# Patient Record
Sex: Female | Born: 1985 | Race: White | Hispanic: No | Smoking: Current every day smoker
Health system: Southern US, Community
[De-identification: ages and names within clinical notes are randomized; demographics above are authoritative.]

## PROBLEM LIST (undated history)

## (undated) ENCOUNTER — Inpatient Hospital Stay: Payer: Self-pay

## (undated) DIAGNOSIS — F419 Anxiety disorder, unspecified: Secondary | ICD-10-CM

## (undated) DIAGNOSIS — F32A Depression, unspecified: Secondary | ICD-10-CM

## (undated) DIAGNOSIS — K219 Gastro-esophageal reflux disease without esophagitis: Secondary | ICD-10-CM

## (undated) DIAGNOSIS — F1910 Other psychoactive substance abuse, uncomplicated: Secondary | ICD-10-CM

## (undated) HISTORY — DX: Anxiety disorder, unspecified: F41.9

## (undated) HISTORY — PX: OTHER SURGICAL HISTORY: SHX169

## (undated) HISTORY — PX: LEEP: SHX91

---
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
## 2005-10-05 ENCOUNTER — Emergency Department: Payer: Self-pay | Admitting: Internal Medicine

## 2006-12-18 ENCOUNTER — Emergency Department: Payer: Self-pay | Admitting: Emergency Medicine

## 2006-12-19 ENCOUNTER — Other Ambulatory Visit: Payer: Self-pay

## 2007-02-17 ENCOUNTER — Emergency Department: Payer: Self-pay | Admitting: Emergency Medicine

## 2007-12-24 ENCOUNTER — Emergency Department: Payer: Self-pay | Admitting: Emergency Medicine

## 2007-12-24 IMAGING — CR RIGHT TIBIA AND FIBULA - 2 VIEW
1 series · 2 of 2 positions shown · non-contrast
Comparison: none

REASON FOR EXAM: Foreign body
COMMENTS:

RESULT:     Images of the RIGHT tibia and fibula demonstrate a curvilinear
metallic density on the AP view superimposed over the proximal third of the
fibula. This is anterior in position on the lateral view. No acute bony
abnormality is evident.

[Series 1: view not recorded · 0.17mm/px · 2 of 2 slices shown]
[im 1/2]
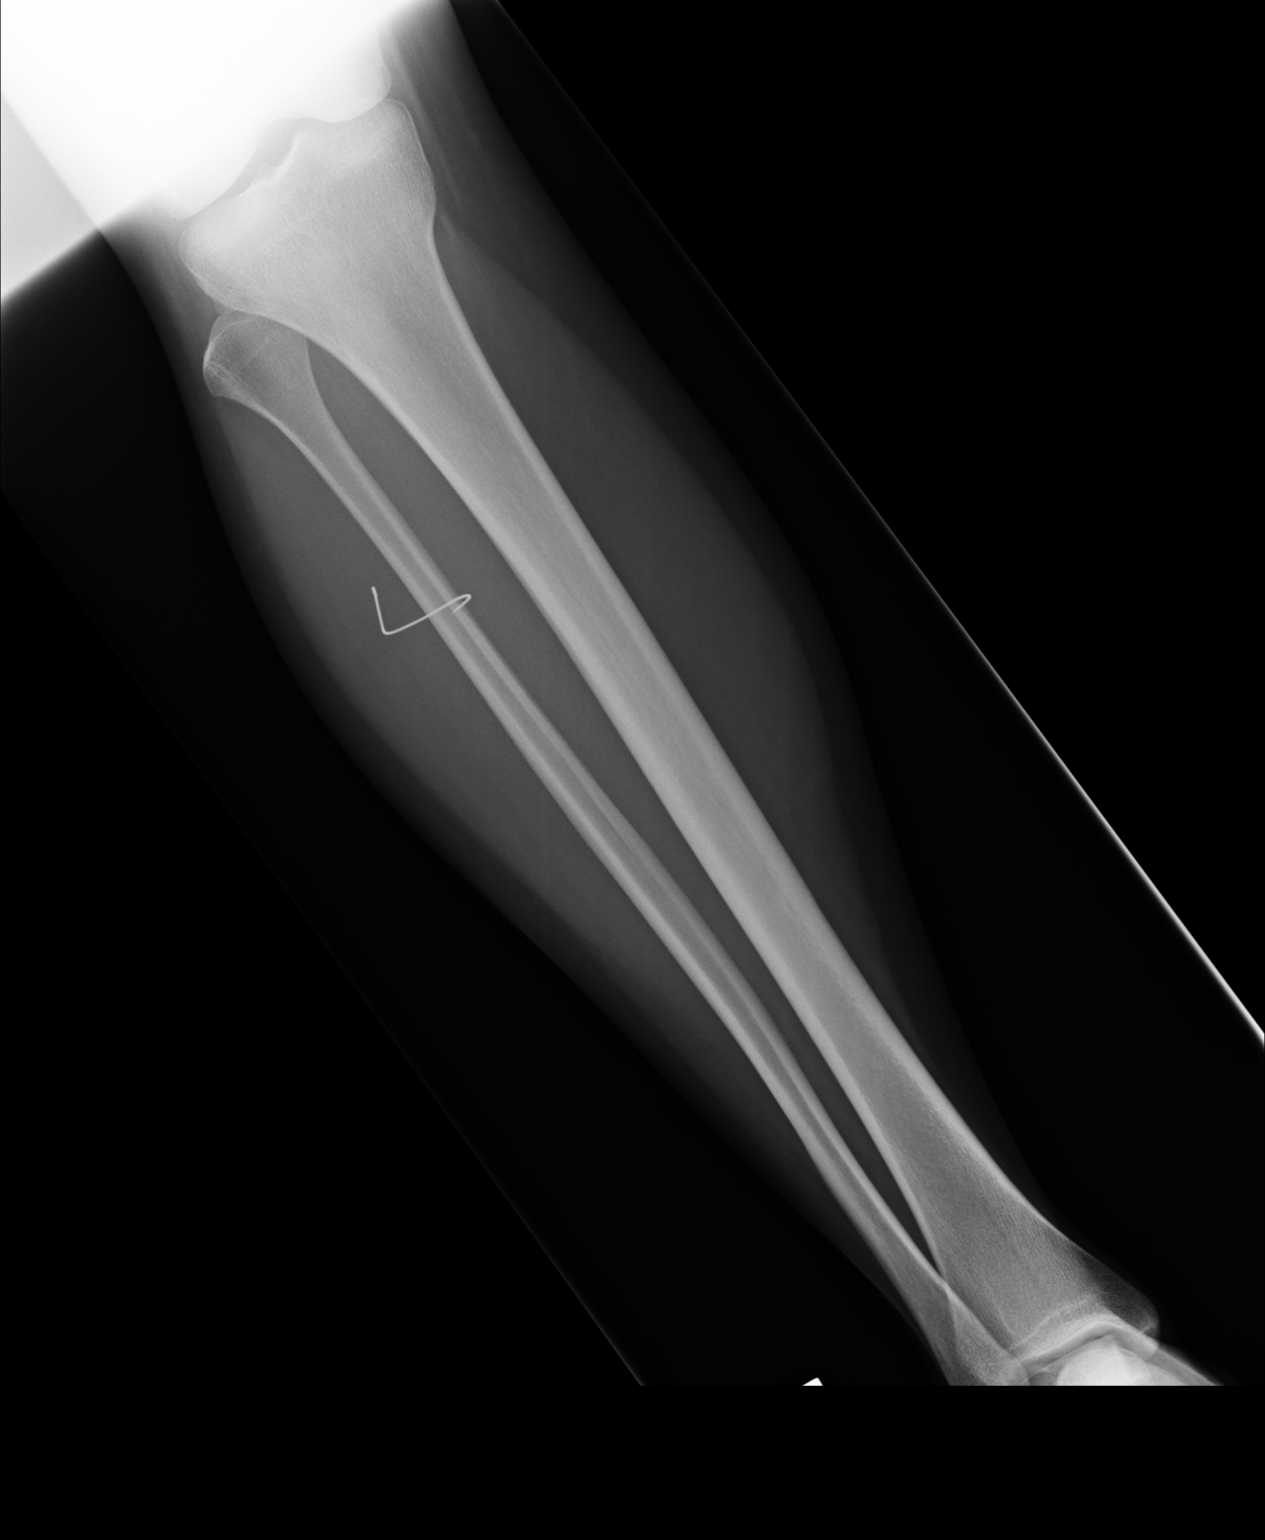
[im 2/2]
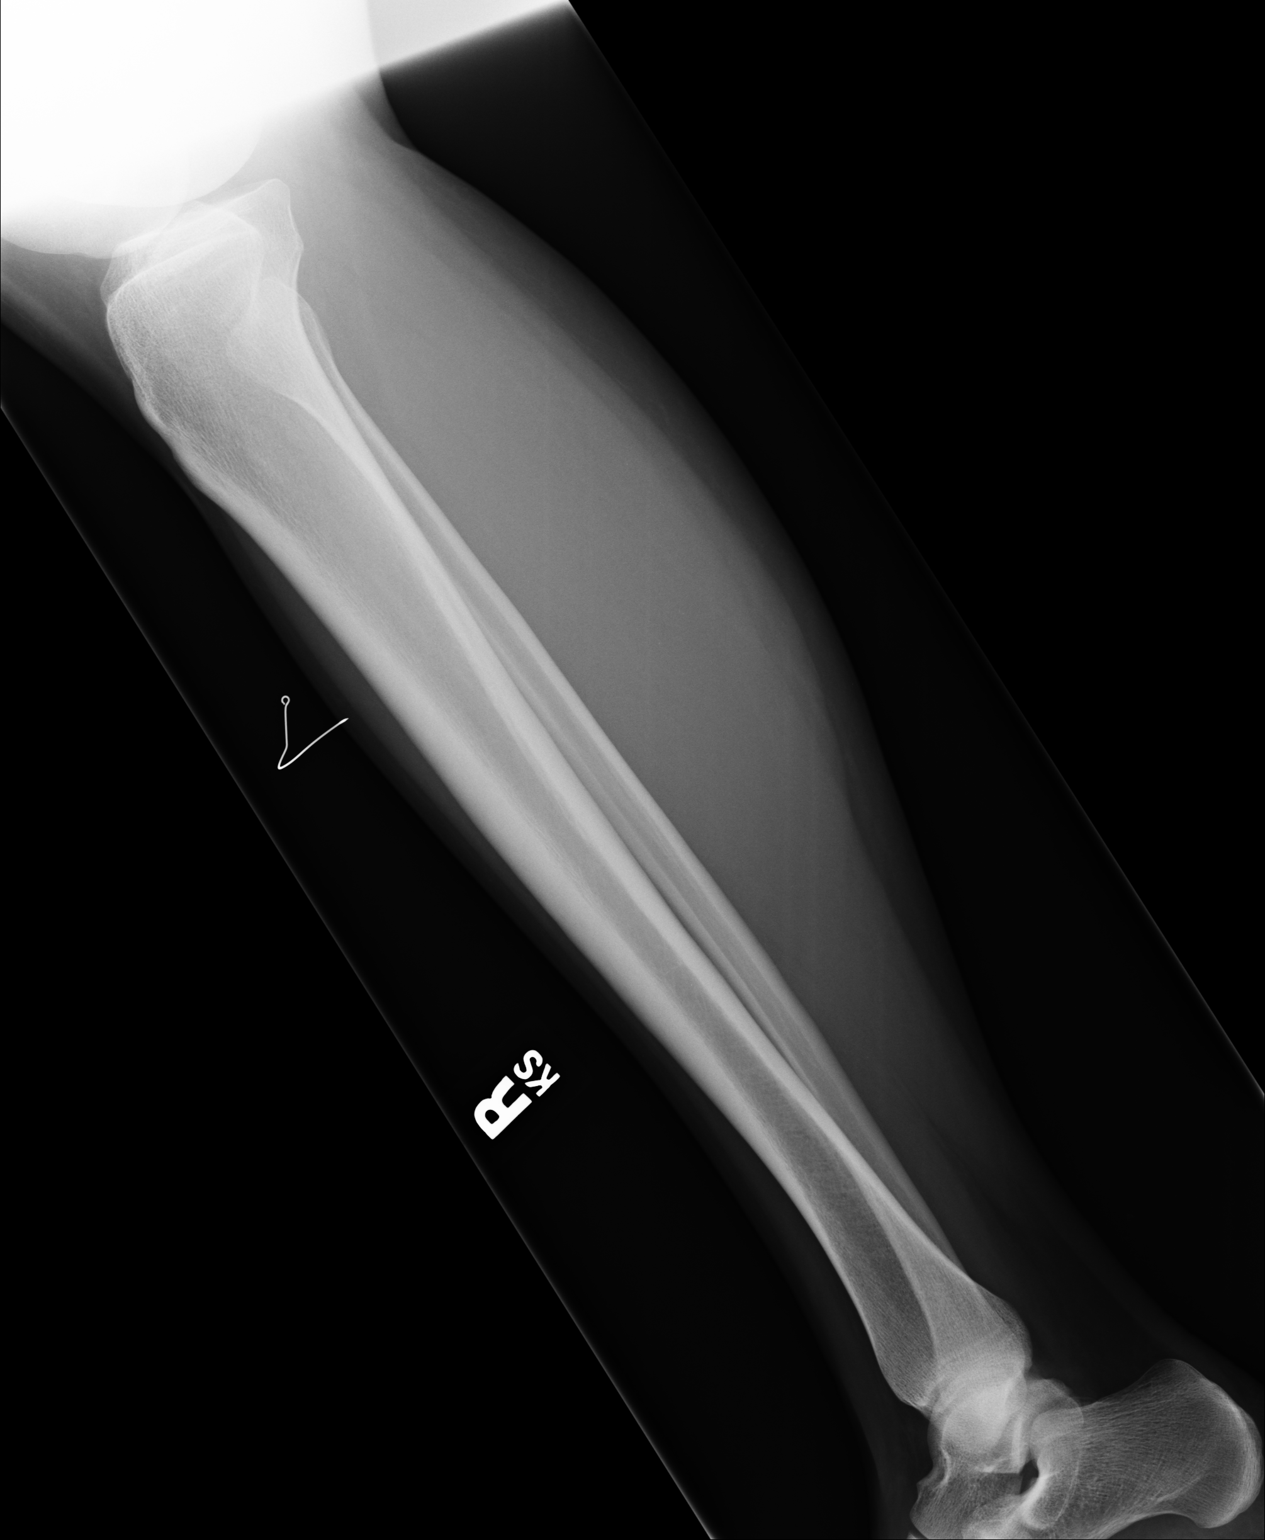

[2 of 2 positions shown; findings below may reference images not displayed]

IMPRESSION: Radiopaque foreign body over the proximal anterolateral
third of the RIGHT lower leg.

## 2009-12-03 ENCOUNTER — Emergency Department: Payer: Self-pay | Admitting: Emergency Medicine
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
## 2010-12-25 ENCOUNTER — Emergency Department: Payer: Self-pay | Admitting: Emergency Medicine

## 2012-07-13 ENCOUNTER — Emergency Department: Payer: Self-pay | Admitting: Emergency Medicine

## 2012-07-13 LAB — CBC
HCT: 41.7 % (ref 35.0–47.0)
HGB: 13.9 g/dL (ref 12.0–16.0)
MCHC: 33.4 g/dL (ref 32.0–36.0)
MCV: 90 fL (ref 80–100)
Platelet: 271 10*3/uL (ref 150–440)
RBC: 4.66 10*6/uL (ref 3.80–5.20)
WBC: 13.8 10*3/uL — ABNORMAL HIGH (ref 3.6–11.0)

## 2012-07-13 LAB — URINALYSIS, COMPLETE
Blood: NEGATIVE
Nitrite: NEGATIVE
Ph: 6 (ref 4.5–8.0)
WBC UR: 2 /HPF (ref 0–5)

## 2012-07-13 LAB — WET PREP, GENITAL

## 2012-07-13 LAB — COMPREHENSIVE METABOLIC PANEL
Albumin: 3.6 g/dL (ref 3.4–5.0)
Alkaline Phosphatase: 68 U/L (ref 50–136)
Anion Gap: 6 — ABNORMAL LOW (ref 7–16)
BUN: 8 mg/dL (ref 7–18)
Chloride: 108 mmol/L — ABNORMAL HIGH (ref 98–107)
EGFR (African American): >60
Glucose: 88 mg/dL (ref 65–99)
Potassium: 3.7 mmol/L (ref 3.5–5.1)
SGOT(AST): 20 U/L (ref 15–37)
SGPT (ALT): 20 U/L (ref 12–78)
Sodium: 138 mmol/L (ref 136–145)
Total Protein: 7 g/dL (ref 6.4–8.2)

## 2012-07-13 IMAGING — US US OB < 14 WEEKS - US OB TV
1 series · 13 of 28 positions shown · non-contrast
Comparison: none

REASON FOR EXAM: left ovarian tenderness, abdominal pain,
8 weeks preg
COMMENTS:

PROCEDURE:     US  - US OB LESS THAN 14 WEEKS/W TRANS  - [DATE]  [DATE]
RESULT:     Comparison: [DATE]
TECHNIQUE: Multiple grayscale and color Doppler images were obtained of the
pelvis via transabdominal and endovaginal ultrasound.

[Series 1: us ob < 14 weeks - us ob tv · 0.25mm/px · 94 acquisitions, 13 frames shown]
[im 4/94]
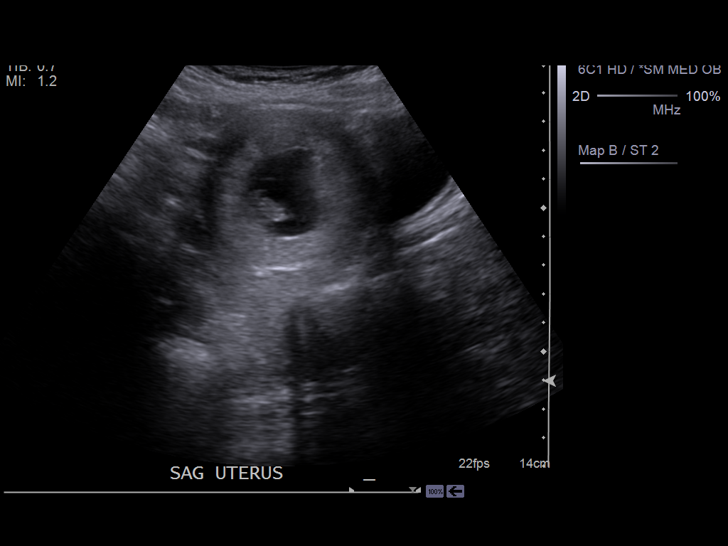
[im 11/94]
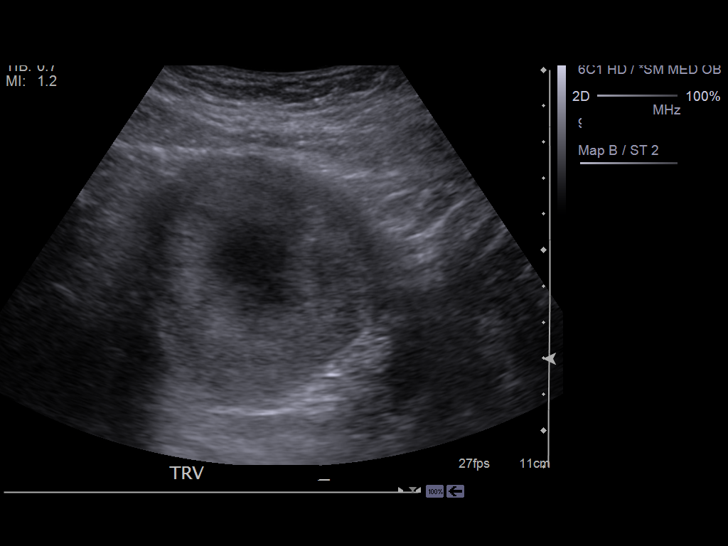
[im 18/94]
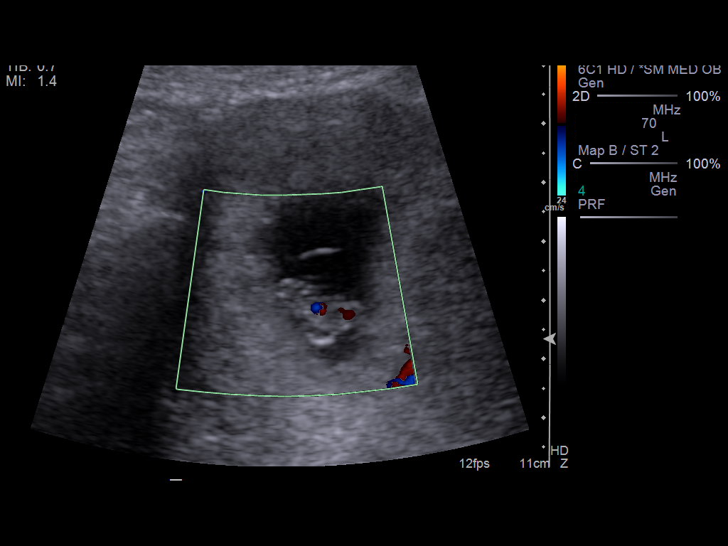
[im 25/94]
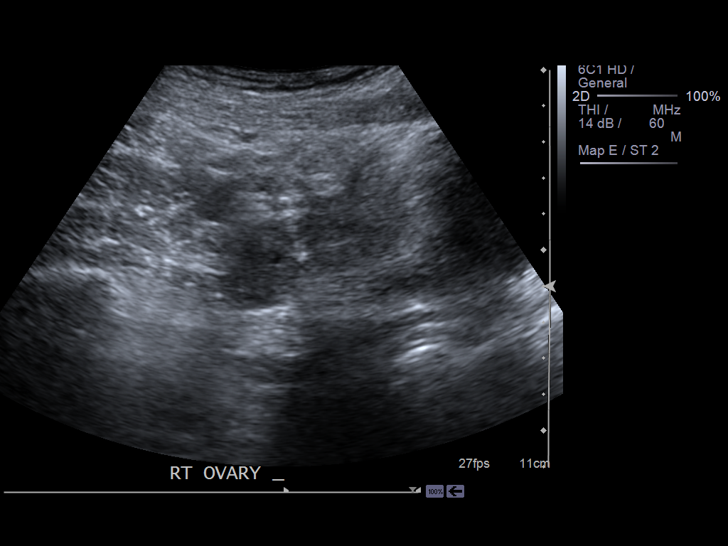
[im 32/94]
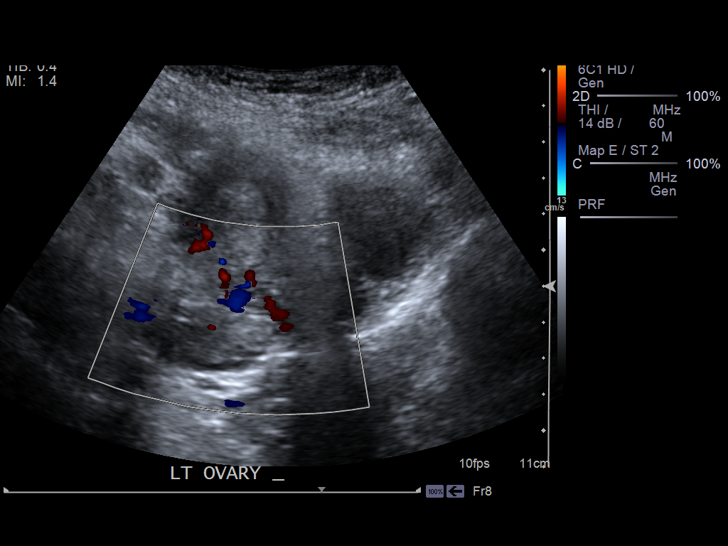
[im 38/94]
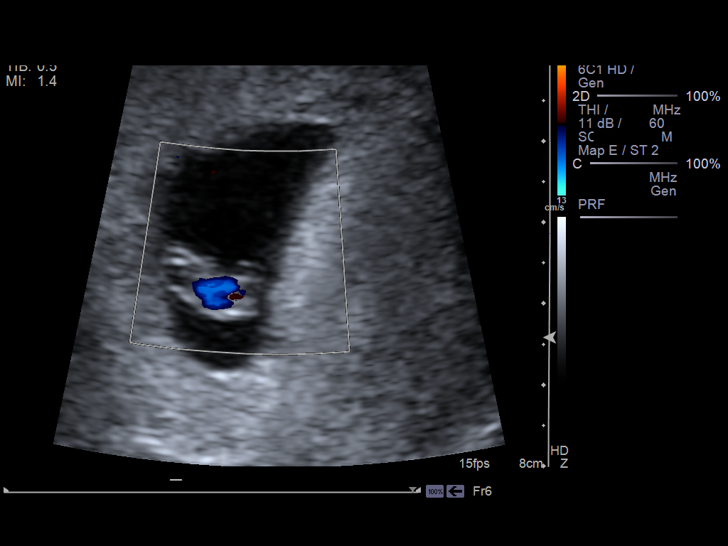
[im 49/94]
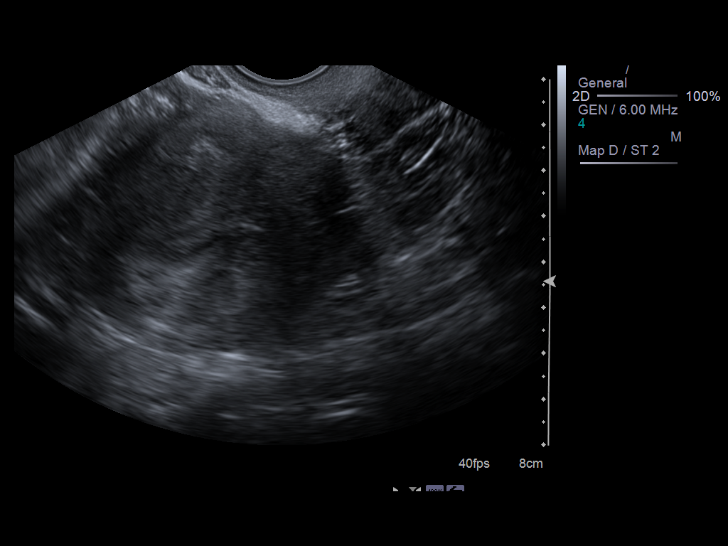
[im 56/94]
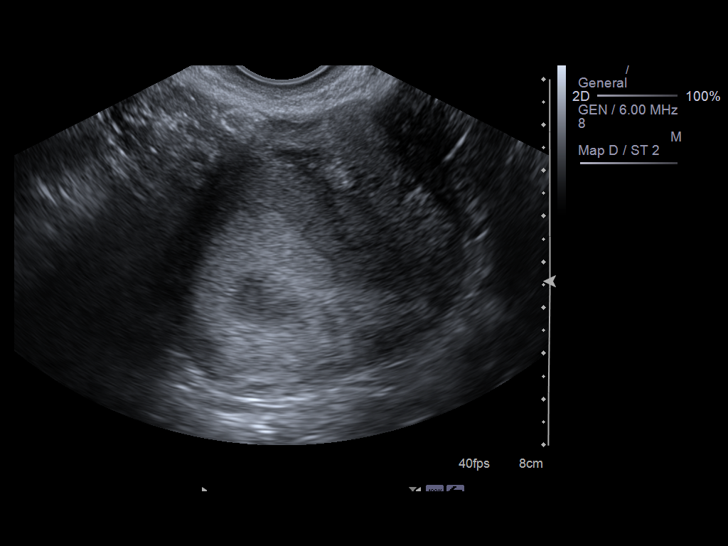
[im 63/94]
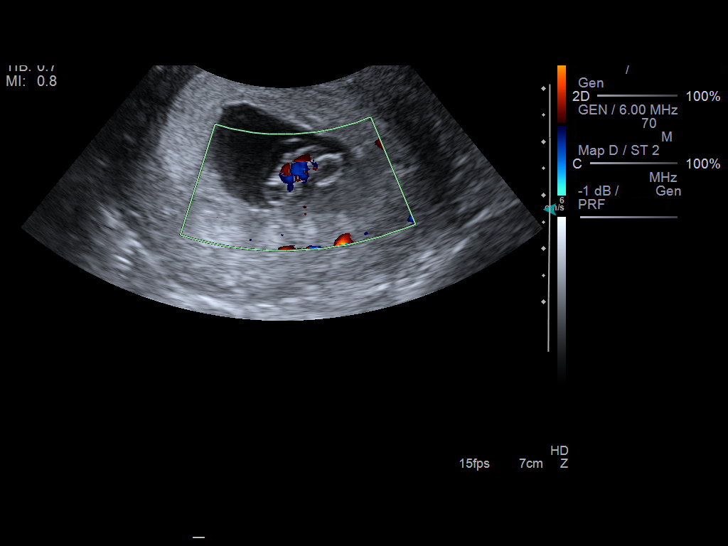
[im 69/94]
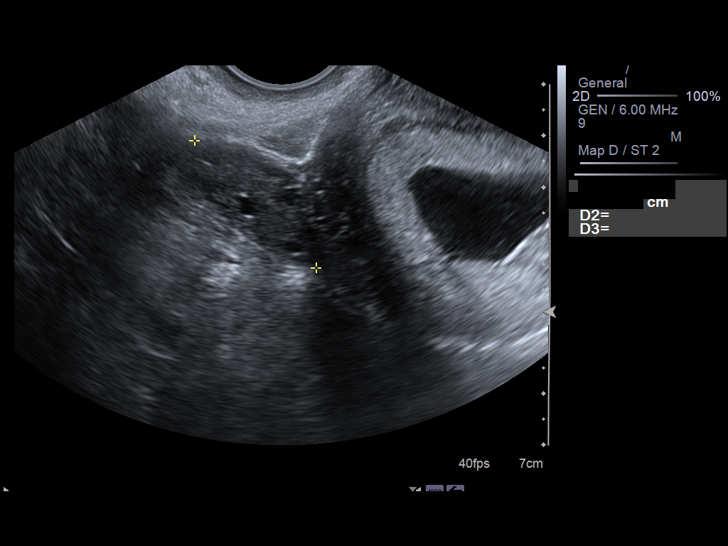
[im 76/94]
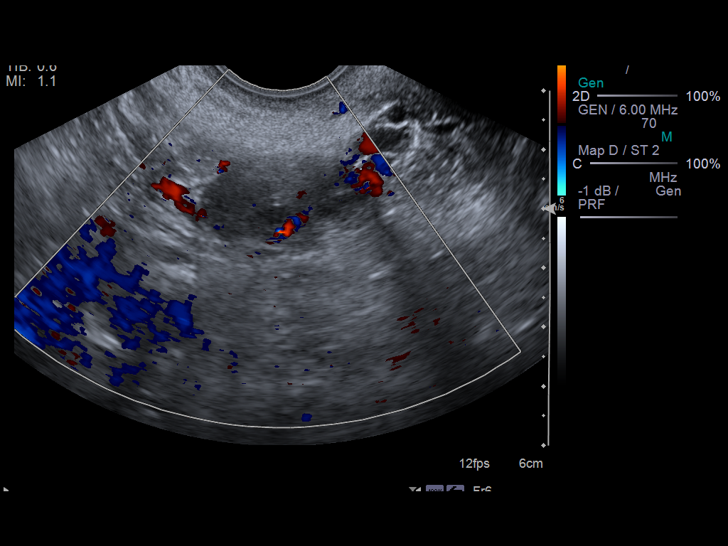
[im 83/94]
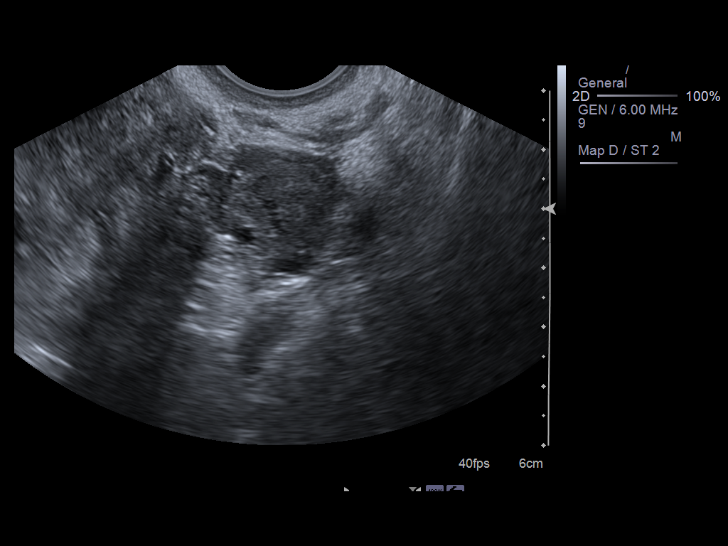
[im 90/94]
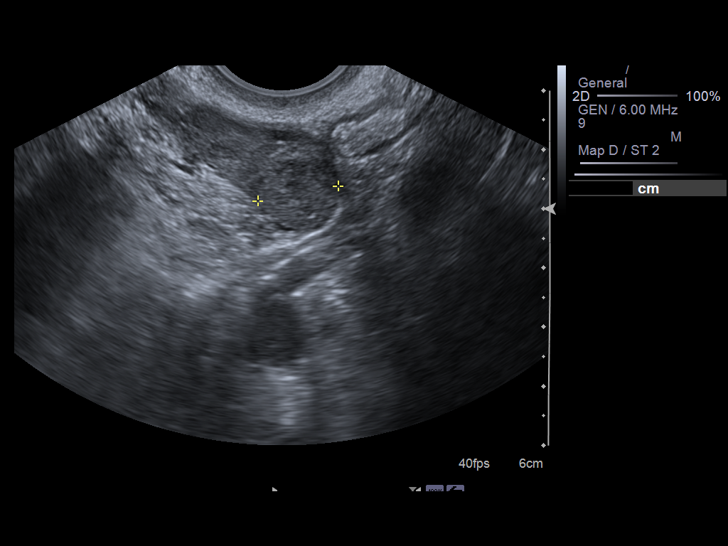

[13 of 28 positions shown; findings below may reference images not displayed]

FINDINGS: There is a gestational sac within the endometrial canal. There is a fetal
pole the crown-rump length of 1.63 cm, which correlates with estimated
gestational age of 8 weeks 0 days. A yolk sac is present. The fetal heart
rate was 155 beats per minute.

The right ovary measures 3.4 x 2.0 x 1.8 cm. The left ovary measures 3.4 x
1.9 x 1.9 cm. Color Doppler flow is associated with the bilateral ovaries.
Spectral Doppler imaging was not performed. There is a small heterogeneous
solid mass in the left ovary which could represent a corpus luteum cyst. It
measures 1.7 x 1.4 x 1.4 cm.
IMPRESSION: 1. Single live intrauterine pregnancy with estimated gestational age of 8
weeks 0 days.
2. There is a small, 1.7 cm solid mass in the left ovary which is
nonspecific, but likely represents a corpus luteum cyst. Followup ultrasound
is suggested.

[REDACTED]

## 2012-09-04 ENCOUNTER — Emergency Department: Payer: Self-pay | Admitting: Emergency Medicine

## 2012-09-04 LAB — CBC
HCT: 36.5 % (ref 35.0–47.0)
RBC: 4.23 10*6/uL (ref 3.80–5.20)
RDW: 13.5 % (ref 11.5–14.5)
WBC: 11.3 10*3/uL — ABNORMAL HIGH (ref 3.6–11.0)

## 2012-09-04 LAB — URINALYSIS, COMPLETE
Glucose,UR: NEGATIVE mg/dL (ref 0–75)
Ketone: NEGATIVE
Leukocyte Esterase: NEGATIVE
Nitrite: NEGATIVE
Ph: 6 (ref 4.5–8.0)
Protein: NEGATIVE
Squamous Epithelial: 4
WBC UR: 1 /HPF (ref 0–5)

## 2012-09-04 LAB — WET PREP, GENITAL

## 2012-09-04 LAB — HCG, QUANTITATIVE, PREGNANCY: Beta Hcg, Quant.: 28995 m[IU]/mL — ABNORMAL HIGH

## 2013-02-24 ENCOUNTER — Inpatient Hospital Stay: Payer: Self-pay

## 2013-02-24 LAB — CBC WITH DIFFERENTIAL/PLATELET
Basophil %: 1 %
Eosinophil #: 0.1 10*3/uL (ref 0.0–0.7)
HCT: 39.4 % (ref 35.0–47.0)
Lymphocyte %: 10.9 %
MCH: 29.9 pg (ref 26.0–34.0)
MCV: 87 fL (ref 80–100)
Monocyte #: 1.2 x10 3/mm — ABNORMAL HIGH (ref 0.2–0.9)
RBC: 4.54 10*6/uL (ref 3.80–5.20)
WBC: 22 10*3/uL — ABNORMAL HIGH (ref 3.6–11.0)

## 2013-02-25 LAB — HEMATOCRIT: HCT: 36.4 % (ref 35.0–47.0)
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
## 2014-06-10 LAB — HM PAP SMEAR: HM Pap smear: NEGATIVE

## 2014-08-06 NOTE — H&P (Signed)
L&D Evaluation:  History:  HPI 29 year old G2 P1001 with EDC=02/24/2013 by LMP=05/20/2012 presents at 40 weeks with onset of contractions at 0600 this AM. No VB or LOF. BAby active. Prior C-section in 2003 for FITL, desires TOLAC. Has been counselled on risks of uterine rupture with spontaneous labor of <1%. [facility] h/o [facility]EEP [facility] 2003, MJ and tobacco use , and an E. coli UTI in Oct txed with Macrobid. Desires BTL pp and has been counselled on permanence, failure rate and risks. LABS: A POS, RI, VI, GBS positive. Had TDAP 01/17/2013.   Presents with contractions   Patient's Surgical History LEEP  Previous C-Section   Medications Pre Natal Vitamins   Allergies NKDA   Social History tobacco  drugs  MJ/ smokes < 1/2 PPD of cigs   Family History Non-Contributory   ROS:  ROS see HPI   Exam:  Urine Protein not completed   General breating thru some ctxs   Mental Status clear   Chest clear   Heart normal sinus rhythm, no murmur/gallop/rubs   Abdomen gravid, tender with contractions   Estimated Fetal Weight Average for gestational age   Fetal Position cephalic, ROT   Edema no edema   Reflexes 1+   Pelvic no external lesions, 2-3/80%/-2/post per RN exam   Mebranes Intact   FHT normal rate with no decels, 135 with accels to 140s to 150   Ucx regular, q3-6 min apart   Skin dry   Impression:  Impression IUP at 40 weeks with prior C-section. Probable early labor. Desires TOLAC.   Plan:  Plan EFM/NST, monitor contractions and for cervical change, Recheck in 1-2 hours. If progressing, will initiate VBAC protocol and start GBS prophyllaxis.   Electronic Signatures: Trinna BalloonGutierrez, Jedediah Noda L (CNM)  (Signed [phone]-Nov-14 11:34)  Authored: L&D Evaluation   Last Updated: 29-Nov-14 11:34 by Trinna BalloonGutierrez, Kushi Kun L (CNM)

## 2014-12-31 ENCOUNTER — Emergency Department
Admission: EM | Admit: 2014-12-31 | Discharge: 2015-01-01 | Disposition: A | Discharge status: Home / Self Care | Payer: Self-pay | Attending: Emergency Medicine | Admitting: Emergency Medicine

## 2014-12-31 ENCOUNTER — Encounter: Payer: Self-pay | Admitting: *Deleted

## 2014-12-31 ENCOUNTER — Emergency Department: Payer: Self-pay

## 2014-12-31 DIAGNOSIS — J029 Acute pharyngitis, unspecified: Secondary | ICD-10-CM

## 2014-12-31 DIAGNOSIS — J069 Acute upper respiratory infection, unspecified: Secondary | ICD-10-CM | POA: Insufficient documentation

## 2014-12-31 DIAGNOSIS — Z720 Tobacco use: Secondary | ICD-10-CM | POA: Insufficient documentation

## 2014-12-31 LAB — BASIC METABOLIC PANEL
Anion gap: 4 — ABNORMAL LOW (ref 5–15)
BUN: 12 mg/dL (ref 6–20)
CO2: 24 mmol/L (ref 22–32)
Calcium: 8.3 mg/dL — ABNORMAL LOW (ref 8.9–10.3)
Chloride: 109 mmol/L (ref 101–111)
Creatinine, Ser: 1.05 mg/dL — ABNORMAL HIGH (ref 0.44–1.00)
GFR calc Af Amer: >60 mL/min (ref >60)
GLUCOSE: 101 mg/dL — AB (ref 65–99)
Potassium: 3.5 mmol/L (ref 3.5–5.1)
Sodium: 137 mmol/L (ref 135–145)

## 2014-12-31 LAB — CBC
HEMATOCRIT: 44.1 % (ref 35.0–47.0)
Hemoglobin: 14.5 g/dL (ref 12.0–16.0)
MCH: 28.6 pg (ref 26.0–34.0)
MCHC: 33 g/dL (ref 32.0–36.0)
MCV: 86.9 fL (ref 80.0–100.0)
Platelets: 212 10*3/uL (ref 150–440)
RBC: 5.08 MIL/uL (ref 3.80–5.20)
RDW: 14.6 % — AB (ref 11.5–14.5)
WBC: 17.3 10*3/uL — ABNORMAL HIGH (ref 3.6–11.0)

## 2014-12-31 LAB — TROPONIN I: Troponin I: <0.03 ng/mL (ref <0.031)

## 2014-12-31 IMAGING — CR DG CHEST 2V
1 series · 2 of 2 positions shown · non-contrast
Comparison: None.

CLINICAL DATA: Chest pain for 3 days

EXAM:
CHEST  2 VIEW

[Series 1: dg chest 2 view · 0.14mm/px · 2 of 2 slices shown]
[im 1/2]
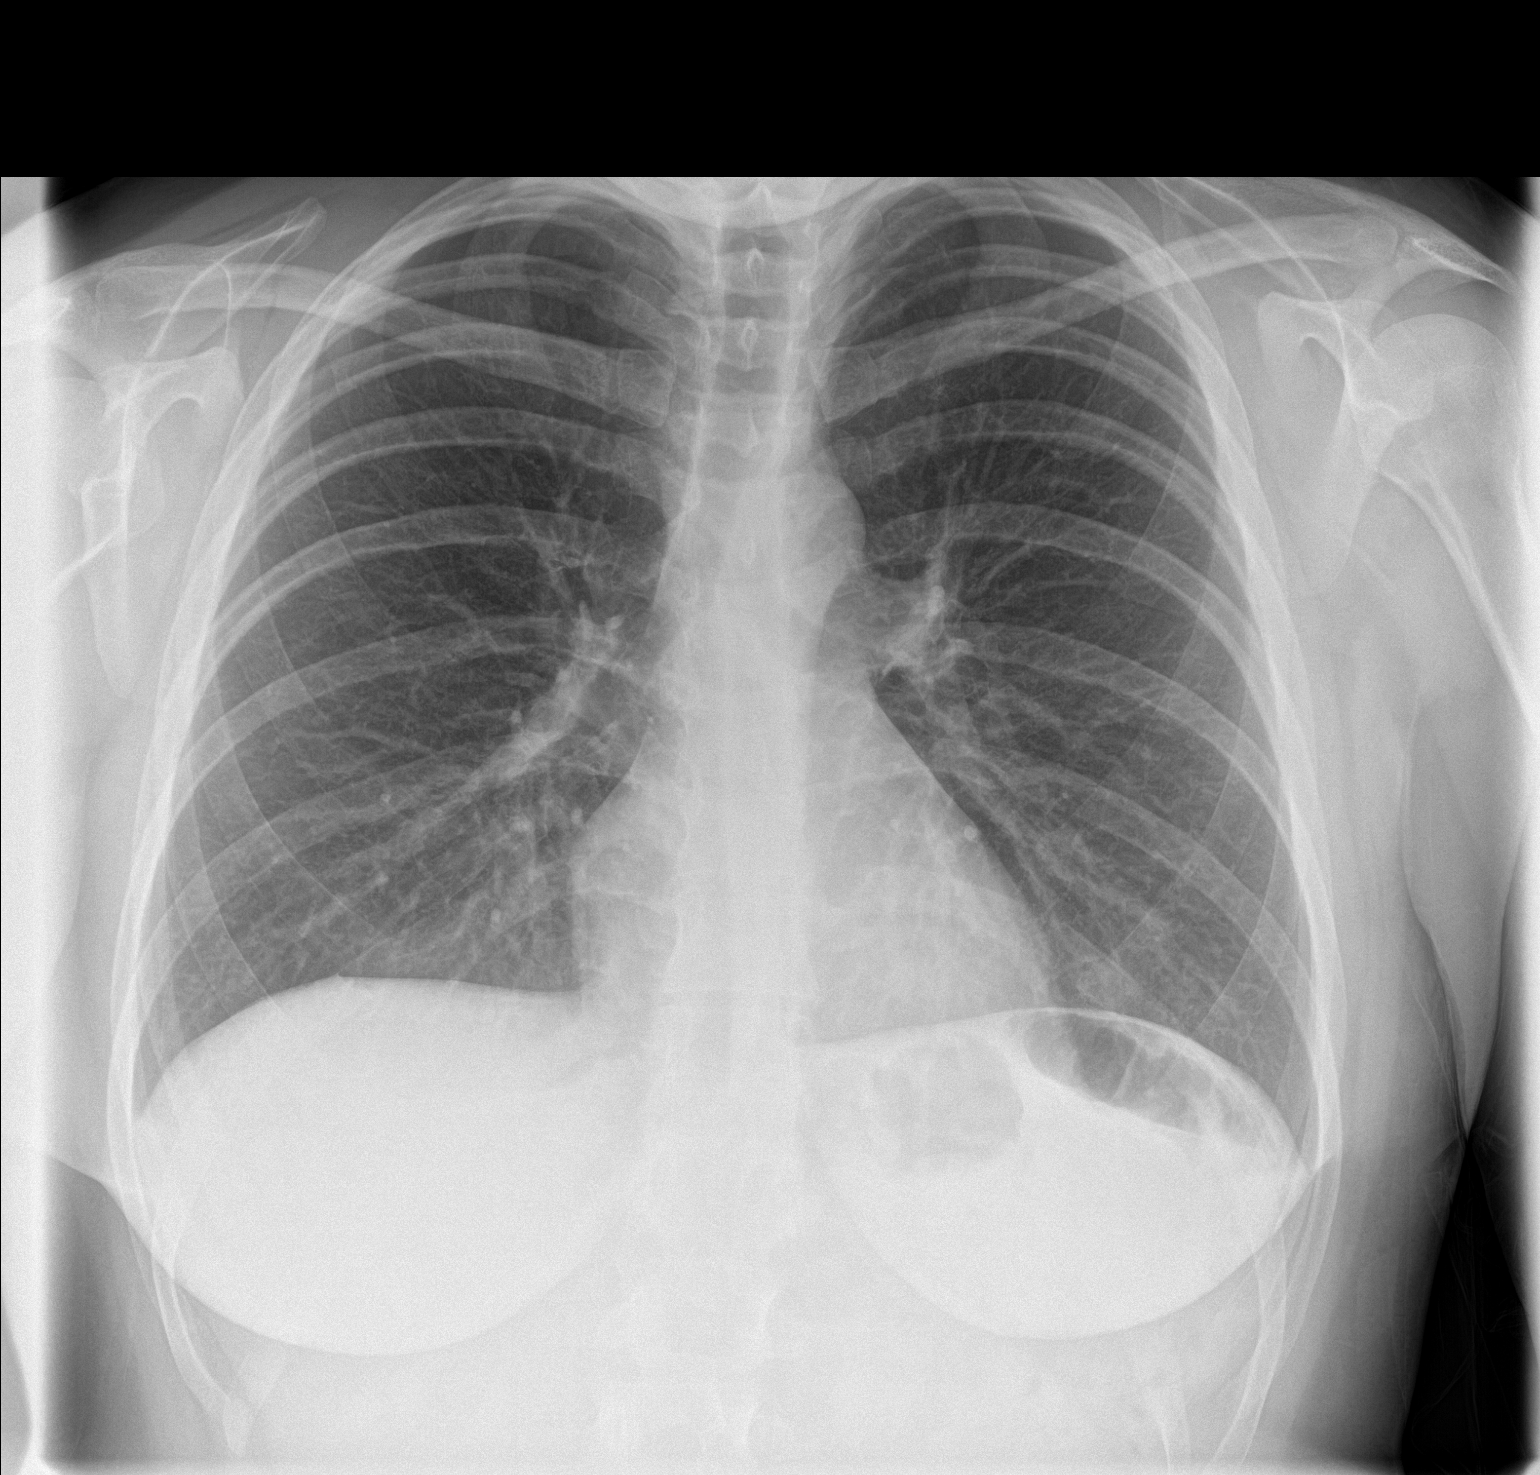
[im 2/2]
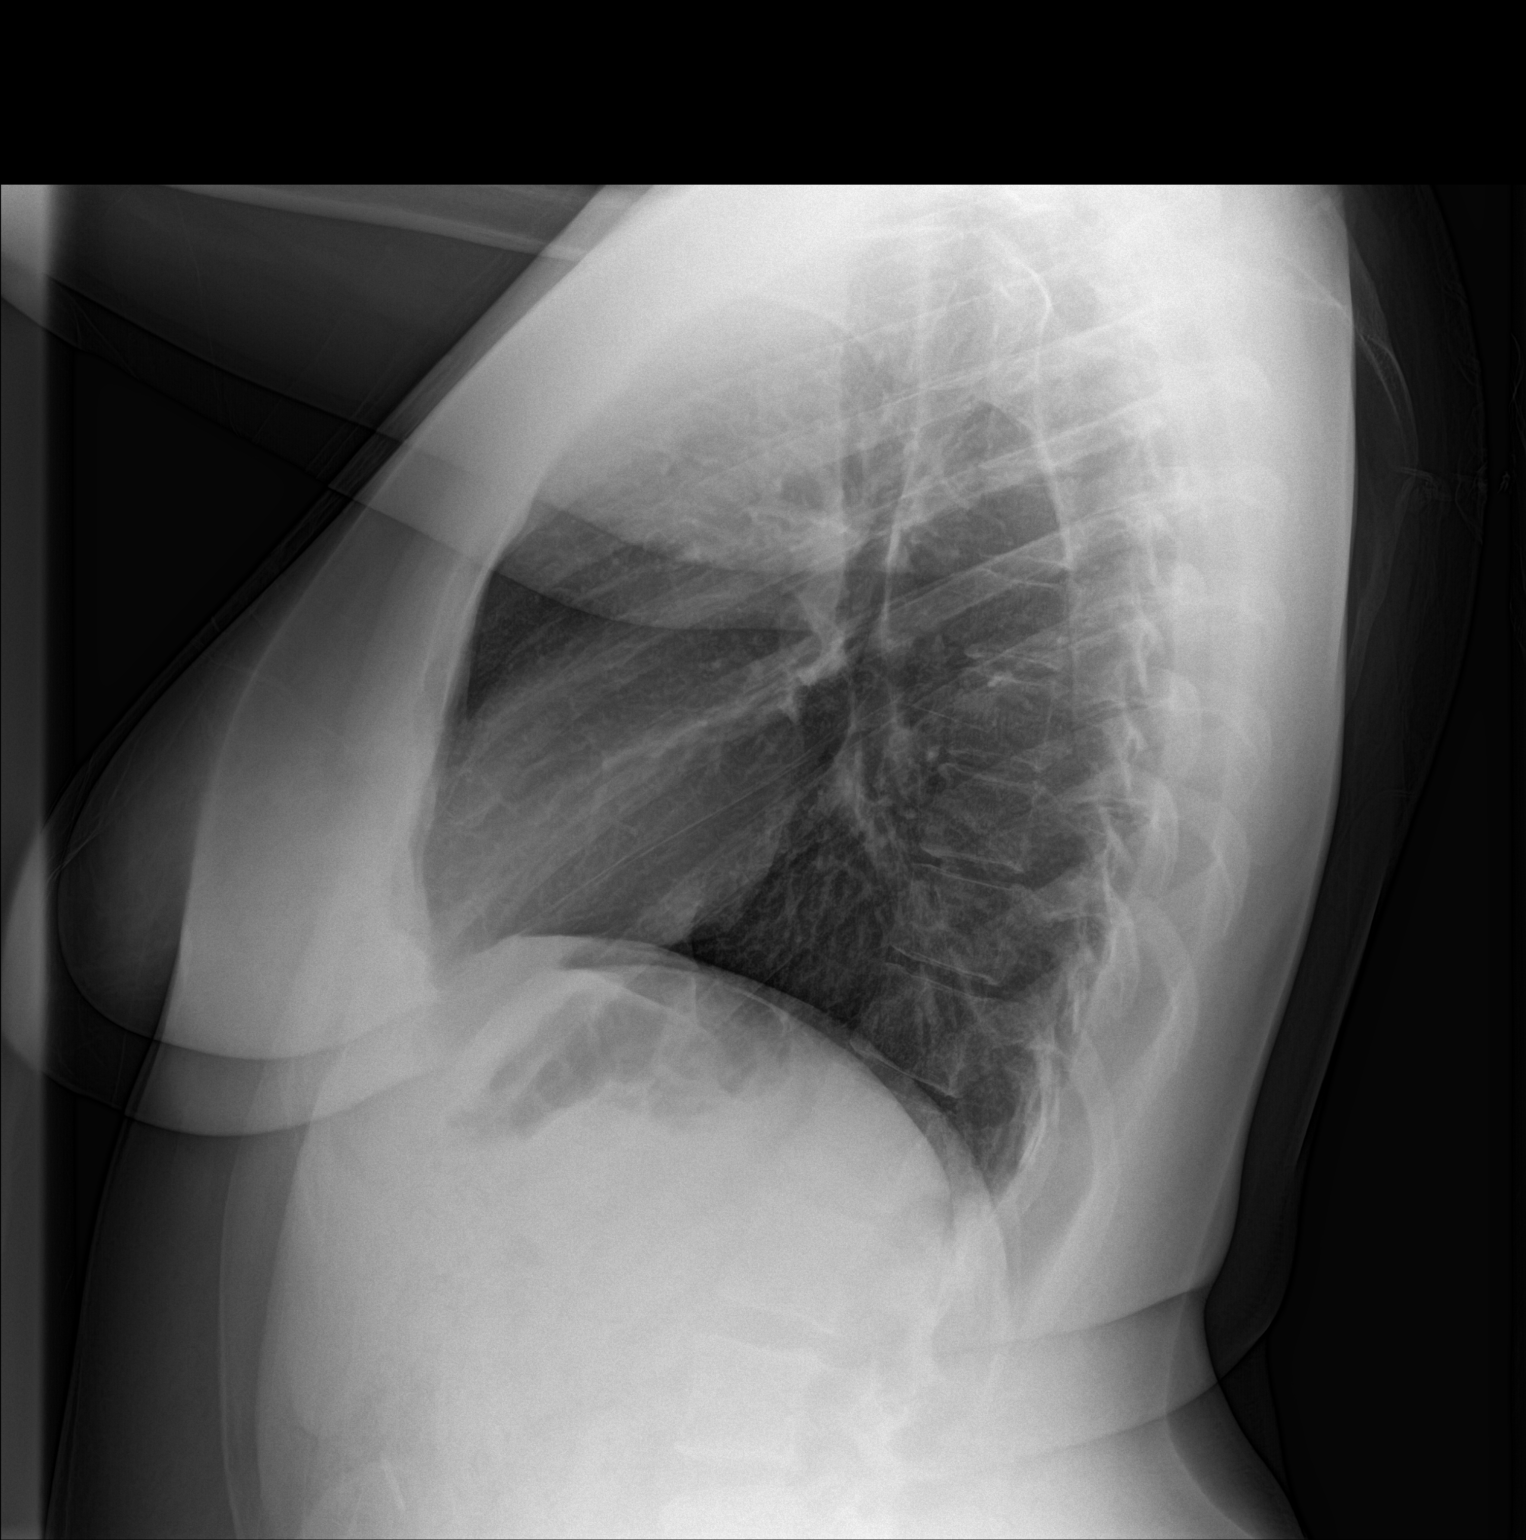

[2 of 2 positions shown; findings below may reference images not displayed]

FINDINGS: Normal heart size.  Clear lungs.
IMPRESSION: No active cardiopulmonary disease.

## 2014-12-31 MED ORDER — MAGIC MOUTHWASH: 5.0000 mL | Freq: Three times a day (TID) | ORAL | Status: DC | PRN

## 2014-12-31 MED ORDER — LEVOFLOXACIN 750 MG PO TABS: 750.0000 mg | ORAL_TABLET | Freq: Every day | ORAL | Status: DC

## 2014-12-31 MED ORDER — LEVOFLOXACIN 750 MG PO TABS
750.0000 mg | ORAL_TABLET | Freq: Once | ORAL | Status: AC
  Administered 2014-12-31: 750 mg via ORAL
  Filled 2014-12-31: qty 1

## 2014-12-31 MED ORDER — MAGIC MOUTHWASH
10.0000 mL | Freq: Once | ORAL | Status: AC
  Administered 2014-12-31: 10 mL via ORAL
  Filled 2014-12-31: qty 10

## 2014-12-31 NOTE — ED Notes (Signed)
Pt reports on Saturday felt like she was having a lot of heartburn. Since it has progressed to issues swallowing, states feels like it is stuck in chest. Reports intermittent sharp pains with this.

## 2014-12-31 NOTE — Discharge Instructions (Signed)
1. Finish antibiotic as prescribed (Levaquin 750 mg daily 6 days). 2. Use Magic mouthwash as needed for throat pain. 3. Drink plenty of fluids daily. 4. Return to the ER for worsening symptoms, persistent vomiting, difficulty breathing or other concerns.  Pharyngitis Pharyngitis is redness, pain, and swelling (inflammation) of your pharynx.  CAUSES  Pharyngitis is usually caused by infection. Most of the time, these infections are from viruses (viral) and are part of a cold. However, sometimes pharyngitis is caused by bacteria (bacterial). Pharyngitis can also be caused by allergies. Viral pharyngitis may be spread from person to person by coughing, sneezing, and personal items or utensils (cups, forks, spoons, toothbrushes). Bacterial pharyngitis may be spread from person to person by more intimate contact, such as kissing.  SIGNS AND SYMPTOMS  Symptoms of pharyngitis include:   Sore throat.   Tiredness (fatigue).   Low-grade fever.   Headache.  Joint pain and muscle aches.  Skin rashes.  Swollen lymph nodes.  Plaque-like film on throat or tonsils (often seen with bacterial pharyngitis). DIAGNOSIS  Your health care provider will ask you questions about your illness and your symptoms. Your medical history, along with a physical exam, is often all that is needed to diagnose pharyngitis. Sometimes, a rapid strep test is done. Other lab tests may also be done, depending on the suspected cause.  TREATMENT  Viral pharyngitis will usually get better in 3-4 days without the use of medicine. Bacterial pharyngitis is treated with medicines that kill germs (antibiotics).  HOME CARE INSTRUCTIONS   Drink enough water and fluids to keep your urine clear or pale yellow.   Only take over-the-counter or prescription medicines as directed by your health care provider:   If you are prescribed antibiotics, make sure you finish them even if you start to feel better.   Do not take aspirin.    Get lots of rest.   Gargle with 8 oz of salt water ( tsp of salt per 1 qt of water) as often as every 1-2 hours to soothe your throat.   Throat lozenges (if you are not at risk for choking) or sprays may be used to soothe your throat. SEEK MEDICAL CARE IF:   You have large, tender lumps in your neck.  You have a rash.  You cough up green, yellow-brown, or bloody spit. SEEK IMMEDIATE MEDICAL CARE IF:   Your neck becomes stiff.  You drool or are unable to swallow liquids.  You vomit or are unable to keep medicines or liquids down.  You have severe pain that does not go away with the use of recommended medicines.  You have trouble breathing (not caused by a stuffy nose). MAKE SURE YOU:   Understand these instructions.  Will watch your condition.  Will get help right away if you are not doing well or get worse.   This information is not intended to replace advice given to you by your health care provider. Make sure you discuss any questions you have with your health care provider.   Document Released: 03/15/2005 Document Revised: 01/03/2013 Document Reviewed: 11/20/2012 Elsevier Interactive Patient Education 2016 [employer]  Upper Respiratory Infection, Adult Most upper respiratory infections (URIs) are a viral infection of the air passages leading to the lungs. A URI affects the nose, throat, and upper air passages. The most common type of URI is nasopharyngitis and is typically referred to as "the common cold." URIs run their course and usually go away on their own. Most of the  time, a URI does not require medical attention, but sometimes a bacterial infection in the upper airways can follow a viral infection. This is called a secondary infection. Sinus and middle ear infections are common types of secondary upper respiratory infections. Bacterial pneumonia can also complicate a URI. A URI can worsen asthma and chronic obstructive pulmonary disease (COPD). Sometimes,  these complications can require emergency medical care and may be life threatening.  CAUSES Almost all URIs are caused by viruses. A virus is a type of germ and can spread from one person to another.  RISKS FACTORS You may be at risk for a URI if:   You smoke.   You have chronic heart or lung disease.  You have a weakened defense (immune) system.   You are very young or very old.   You have nasal allergies or asthma.  You work in crowded or poorly ventilated areas.  You work in health care facilities or schools. SIGNS AND SYMPTOMS  Symptoms typically develop 2-3 days after you come in contact with a cold virus. Most viral URIs last 7-10 days. However, viral URIs from the influenza virus (flu virus) can last 14-18 days and are typically more severe. Symptoms may include:   Runny or stuffy (congested) nose.   Sneezing.   Cough.   Sore throat.   Headache.   Fatigue.   Fever.   Loss of appetite.   Pain in your forehead, behind your eyes, and over your cheekbones (sinus pain).  Muscle aches.  DIAGNOSIS  Your health care provider may diagnose a URI by:  Physical exam.  Tests to check that your symptoms are not due to another condition such as:  Strep throat.  Sinusitis.  Pneumonia.  Asthma. TREATMENT  A URI goes away on its own with time. It cannot be cured with medicines, but medicines may be prescribed or recommended to relieve symptoms. Medicines may help:  Reduce your fever.  Reduce your cough.  Relieve nasal congestion. HOME CARE INSTRUCTIONS   Take medicines only as directed by your health care provider.   Gargle warm saltwater or take cough drops to comfort your throat as directed by your health care provider.  Use a warm mist humidifier or inhale steam from a shower to increase air moisture. This may make it easier to breathe.  Drink enough fluid to keep your urine clear or pale yellow.   Eat soups and other clear broths and  maintain good nutrition.   Rest as needed.   Return to work when your temperature has returned to normal or as your health care provider advises. You may need to stay home longer to avoid infecting others. You can also use a face mask and careful hand washing to prevent spread of the virus.  Increase the usage of your inhaler if you have asthma.   Do not use any tobacco products, including cigarettes, chewing tobacco, or electronic cigarettes. If you need help quitting, ask your health care provider. PREVENTION  The best way to protect yourself from getting a cold is to practice good hygiene.   Avoid oral or hand contact with people with cold symptoms.   Wash your hands often if contact occurs.  There is no clear evidence that vitamin C, vitamin E, echinacea, or exercise reduces the chance of developing a cold. However, it is always recommended to get plenty of rest, exercise, and practice good nutrition.  SEEK MEDICAL CARE IF:   You are getting worse rather than better.  Your symptoms are not controlled by medicine.   You have chills.  You have worsening shortness of breath.  You have brown or red mucus.  You have yellow or brown nasal discharge.  You have pain in your face, especially when you bend forward.  You have a fever.  You have swollen neck glands.  You have pain while swallowing.  You have white areas in the back of your throat. SEEK IMMEDIATE MEDICAL CARE IF:   You have severe or persistent:  Headache.  Ear pain.  Sinus pain.  Chest pain.  You have chronic lung disease and any of the following:  Wheezing.  Prolonged cough.  Coughing up blood.  A change in your usual mucus.  You have a stiff neck.  You have changes in your:  Vision.  Hearing.  Thinking.  Mood. MAKE SURE YOU:   Understand these instructions.  Will watch your condition.  Will get help right away if you are not doing well or get worse.   This information  is not intended to replace advice given to you by your health care provider. Make sure you discuss any questions you have with your health care provider.   Document Released: 09/08/2000 Document Revised: 07/30/2014 Document Reviewed: 06/20/2013 Elsevier Interactive Patient Education [employer]

## 2014-12-31 NOTE — ED Notes (Signed)
Pt reports she has a sore throat and pain with swallowing.  Pt also reports it hurts in her chest and has a cough.  cig smoker.  No fever.  Sx for several days.

## 2014-12-31 NOTE — ED Provider Notes (Signed)
[facility] Emergency Department Provider Note  ____________________________________________  Time seen: Approximately 11:14 PM  I have reviewed the triage vital signs and the nursing notes.   HISTORY  Chief Complaint Chest Pain    HPI Christina Marquez is a 29 y.o. female who presents to the ED from home for a chief complaint of sore throat, pain with swallowing, dry cough and chest pain. Symptoms ongoing 3-4 days. Denies sick contacts. Denies recent fever, chills, abdominal pain, nausea, vomiting, diarrhea, headache, neck pain, rash. Denies recent travel. Nothing makes her symptoms better or worse.   Past medical history None   There are no active problems to display for this patient.   Past Surgical History  Procedure Laterality Date  . Leep      No current outpatient prescriptions on file.  Allergies Review of patient's allergies indicates no known allergies.  No family history on file.  Social History Social History  Substance Use Topics  . Smoking status: Current Every Day Smoker  . Smokeless tobacco: None  . Alcohol Use: Yes    Review of Systems Constitutional: No fever/chills Eyes: No visual changes. ENT: Positive for sore throat. Cardiovascular: Denies chest pain. Respiratory: Positive for dry cough. Denies shortness of breath. Gastrointestinal: No abdominal pain.  No nausea, no vomiting.  No diarrhea.  No constipation. Genitourinary: Negative for dysuria. Musculoskeletal: Negative for back pain. Skin: Negative for rash. Neurological: Negative for headaches, focal weakness or numbness.  10-point ROS otherwise negative.  ____________________________________________   PHYSICAL EXAM:  VITAL SIGNS: ED Triage Vitals  Enc Vitals Group     BP 12/31/14 2140 126/66 mmHg     Pulse Rate 12/31/14 2140 95     Resp 12/31/14 2140 16     Temp 12/31/14 2140 98 F (36.7 C)     Temp Source 12/31/14 2140 Oral     SpO2 12/31/14 2140 98  %     Weight 12/31/14 2140 189 lb (85.73 kg)     Height 12/31/14 2140  (1.676 m)     Head Cir --      Peak Flow --      Pain Score 12/31/14 2137 5     Pain Loc --      Pain Edu? --      Excl. in GC? --     Constitutional: Alert and oriented. Well appearing and in no acute distress. Eyes: Conjunctivae are normal. PERRL. EOMI. Head: Atraumatic. Nose: No congestion/rhinnorhea. Mouth/Throat: Mucous membranes are moist.  Oropharynx mildly erythematous. + Hoarse voice. No tonsillar swelling or exudates. No peritonsillar abscess. There is no drooling or muffled voice. Neck: No stridor. No carotid bruits. Hematological/Lymphatic/Immunilogical: Shotty anterior cervical lymphadenopathy. Cardiovascular: Normal rate, regular rhythm. Grossly normal heart sounds.  Good peripheral circulation. Respiratory: Normal respiratory effort.  No retractions. Lungs CTAB. Gastrointestinal: Soft and nontender. No distention. No abdominal bruits. No CVA tenderness. Musculoskeletal: No lower extremity tenderness nor edema.  No joint effusions. Neurologic:  Normal speech and language. No gross focal neurologic deficits are appreciated. No gait instability. Skin:  Skin is warm, dry and intact. No rash noted. Psychiatric: Mood and affect are normal. Speech and behavior are normal.  ____________________________________________   LABS (all labs ordered are listed, but only abnormal results are displayed)  Labs Reviewed  BASIC METABOLIC PANEL - Abnormal; Notable for the following:    Glucose, Bld 101 (*)    Creatinine, Ser 1.05 (*)    Calcium 8.3 (*)    Anion gap 4 (*)  All other components within normal limits  CBC - Abnormal; Notable for the following:    WBC 17.3 (*)    RDW 14.6 (*)    All other components within normal limits  TROPONIN I   ____________________________________________  EKG  ED ECG REPORT I, SUNG,JADE J, the attending physician, personally viewed and interpreted this ECG.    Date: 12/31/2014  EKG Time: 2144  Rate: 112  Rhythm: sinus tachycardia  Axis: Normal  Intervals:none  ST&T Change: Nonspecific  ____________________________________________  RADIOLOGY  Chest 2 view (viewed by me, interpreted per Dr. Bonnielee Haff): No active cardiopulmonary disease. __________________________________________   PROCEDURES  Procedure(s) performed: None  Critical Care performed: No  ____________________________________________   INITIAL IMPRESSION / ASSESSMENT AND PLAN / ED COURSE  Pertinent labs & imaging results that were available during my care of the patient were reviewed by me and considered in my medical decision making (see chart for details).  29 year old female with pharyngitis and upper respiratory infection. Laboratory results notable for leukocytosis. Will treat with Levaquin and Magic mouthwash. Close follow-up with PCP. Strict return precautions given. Patient and mother verbalize understanding and agree with plan of care. ____________________________________________   FINAL CLINICAL IMPRESSION(S) / ED DIAGNOSES  Final diagnoses:  Pharyngitis  Upper respiratory infection      Irean Hong, MD 01/01/15 [phone]

## 2015-05-17 ENCOUNTER — Encounter: Payer: Self-pay | Admitting: Emergency Medicine

## 2015-05-17 ENCOUNTER — Emergency Department: Payer: No Typology Code available for payment source

## 2015-05-17 ENCOUNTER — Emergency Department
Admission: EM | Admit: 2015-05-17 | Discharge: 2015-05-17 | Disposition: A | Discharge status: Home / Self Care | Payer: No Typology Code available for payment source | Attending: Emergency Medicine | Admitting: Emergency Medicine

## 2015-05-17 DIAGNOSIS — S199XXA Unspecified injury of neck, initial encounter: Secondary | ICD-10-CM | POA: Diagnosis present

## 2015-05-17 DIAGNOSIS — S29012A Strain of muscle and tendon of back wall of thorax, initial encounter: Secondary | ICD-10-CM | POA: Insufficient documentation

## 2015-05-17 DIAGNOSIS — Y998 Other external cause status: Secondary | ICD-10-CM | POA: Insufficient documentation

## 2015-05-17 DIAGNOSIS — Y92410 Unspecified street and highway as the place of occurrence of the external cause: Secondary | ICD-10-CM | POA: Diagnosis not present

## 2015-05-17 DIAGNOSIS — S5001XA Contusion of right elbow, initial encounter: Secondary | ICD-10-CM

## 2015-05-17 DIAGNOSIS — S40211A Abrasion of right shoulder, initial encounter: Secondary | ICD-10-CM | POA: Diagnosis not present

## 2015-05-17 DIAGNOSIS — IMO0001 Reserved for inherently not codable concepts without codable children: Secondary | ICD-10-CM

## 2015-05-17 DIAGNOSIS — Y9389 Activity, other specified: Secondary | ICD-10-CM | POA: Diagnosis not present

## 2015-05-17 DIAGNOSIS — S161XXA Strain of muscle, fascia and tendon at neck level, initial encounter: Secondary | ICD-10-CM | POA: Insufficient documentation

## 2015-05-17 DIAGNOSIS — Z792 Long term (current) use of antibiotics: Secondary | ICD-10-CM | POA: Diagnosis not present

## 2015-05-17 DIAGNOSIS — S50811A Abrasion of right forearm, initial encounter: Secondary | ICD-10-CM | POA: Diagnosis not present

## 2015-05-17 DIAGNOSIS — S60219A Contusion of unspecified wrist, initial encounter: Secondary | ICD-10-CM | POA: Diagnosis not present

## 2015-05-17 DIAGNOSIS — S40021A Contusion of right upper arm, initial encounter: Secondary | ICD-10-CM

## 2015-05-17 DIAGNOSIS — S40011A Contusion of right shoulder, initial encounter: Secondary | ICD-10-CM | POA: Insufficient documentation

## 2015-05-17 DIAGNOSIS — S20311A Abrasion of right front wall of thorax, initial encounter: Secondary | ICD-10-CM | POA: Diagnosis not present

## 2015-05-17 DIAGNOSIS — S29019A Strain of muscle and tendon of unspecified wall of thorax, initial encounter: Secondary | ICD-10-CM

## 2015-05-17 DIAGNOSIS — F17200 Nicotine dependence, unspecified, uncomplicated: Secondary | ICD-10-CM | POA: Diagnosis not present
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
## 2015-05-17 MED ORDER — IBUPROFEN 600 MG PO TABS: 600.0000 mg | ORAL_TABLET | Freq: Three times a day (TID) | ORAL | Status: DC | PRN

## 2015-05-17 NOTE — Discharge Instructions (Signed)
Ice and elevate elbow as needed for swelling. Wear sling for support. Ibuprofen as needed for pain and inflammation. Take this medication with food. Follow-up with one of the clinics listed on your paperwork if any continued problems.
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
## 2015-05-17 NOTE — ED Notes (Signed)
NAD noted at time of D/C. Pt denies questions or concerns. Pt ambulatory to the lobby at this time.  

## 2015-10-27 ENCOUNTER — Emergency Department
Admission: EM | Admit: 2015-10-27 | Discharge: 2015-10-27 | Disposition: A | Discharge status: Home / Self Care | Payer: No Typology Code available for payment source | Attending: Emergency Medicine | Admitting: Emergency Medicine

## 2015-10-27 ENCOUNTER — Emergency Department: Payer: No Typology Code available for payment source

## 2015-10-27 ENCOUNTER — Encounter: Payer: Self-pay | Admitting: Emergency Medicine

## 2015-10-27 DIAGNOSIS — Y92410 Unspecified street and highway as the place of occurrence of the external cause: Secondary | ICD-10-CM | POA: Diagnosis not present

## 2015-10-27 DIAGNOSIS — Y999 Unspecified external cause status: Secondary | ICD-10-CM | POA: Insufficient documentation

## 2015-10-27 DIAGNOSIS — S80212A Abrasion, left knee, initial encounter: Secondary | ICD-10-CM | POA: Diagnosis not present

## 2015-10-27 DIAGNOSIS — S80211A Abrasion, right knee, initial encounter: Secondary | ICD-10-CM | POA: Insufficient documentation

## 2015-10-27 DIAGNOSIS — S63502A Unspecified sprain of left wrist, initial encounter: Secondary | ICD-10-CM | POA: Diagnosis not present

## 2015-10-27 DIAGNOSIS — T148XXA Other injury of unspecified body region, initial encounter: Secondary | ICD-10-CM

## 2015-10-27 DIAGNOSIS — F17200 Nicotine dependence, unspecified, uncomplicated: Secondary | ICD-10-CM | POA: Diagnosis not present

## 2015-10-27 DIAGNOSIS — Y9389 Activity, other specified: Secondary | ICD-10-CM | POA: Diagnosis not present

## 2015-10-27 DIAGNOSIS — S6992XA Unspecified injury of left wrist, hand and finger(s), initial encounter: Secondary | ICD-10-CM | POA: Diagnosis present

## 2015-10-27 LAB — URINALYSIS COMPLETE WITH MICROSCOPIC (ARMC ONLY)
Bilirubin Urine: NEGATIVE
Glucose, UA: NEGATIVE mg/dL
KETONES UR: NEGATIVE mg/dL
NITRITE: POSITIVE — AB
PROTEIN: NEGATIVE mg/dL
SPECIFIC GRAVITY, URINE: 1.01 (ref 1.005–1.030)
pH: 6 (ref 5.0–8.0)

## 2015-10-27 LAB — BASIC METABOLIC PANEL
ANION GAP: 6 (ref 5–15)
BUN: 10 mg/dL (ref 6–20)
CALCIUM: 9.2 mg/dL (ref 8.9–10.3)
CO2: 25 mmol/L (ref 22–32)
Chloride: 107 mmol/L (ref 101–111)
Creatinine, Ser: 0.76 mg/dL (ref 0.44–1.00)
Glucose, Bld: 123 mg/dL — ABNORMAL HIGH (ref 65–99)
POTASSIUM: 3.3 mmol/L — AB (ref 3.5–5.1)
SODIUM: 138 mmol/L (ref 135–145)

## 2015-10-27 LAB — CBC
HEMATOCRIT: 42.6 % (ref 35.0–47.0)
HEMOGLOBIN: 14.5 g/dL (ref 12.0–16.0)
MCH: 29.3 pg (ref 26.0–34.0)
MCHC: 34 g/dL (ref 32.0–36.0)
MCV: 86.2 fL (ref 80.0–100.0)
Platelets: 241 10*3/uL (ref 150–440)
RBC: 4.94 MIL/uL (ref 3.80–5.20)
RDW: 14.4 % (ref 11.5–14.5)
WBC: 15.5 10*3/uL — AB (ref 3.6–11.0)
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
## 2015-10-27 MED ORDER — OXYCODONE-ACETAMINOPHEN 5-325 MG PO TABS
2.0000 | ORAL_TABLET | Freq: Once | ORAL | Status: AC
  Administered 2015-10-27: 2 via ORAL

## 2015-10-27 MED ORDER — OXYCODONE-ACETAMINOPHEN 5-325 MG PO TABS: 2.0000 | ORAL_TABLET | Freq: Four times a day (QID) | ORAL | 0 refills | Status: DC | PRN
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
## 2015-10-27 NOTE — ED Notes (Signed)
Pt. Verbalizes understanding of d/c instructions, prescriptions, and follow-up. VS stable.  Pt. In NAD at time of d/c and denies concerns. Pt. Wheeled Out of the unit by mother at the side.

## 2015-10-27 NOTE — ED Notes (Signed)
Bedside POCT negative
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
## 2017-02-22 ENCOUNTER — Other Ambulatory Visit: Payer: Self-pay

## 2017-02-22 ENCOUNTER — Emergency Department
Admission: EM | Admit: 2017-02-22 | Discharge: 2017-02-22 | Disposition: A | Discharge status: Home / Self Care | Payer: Self-pay | Attending: Emergency Medicine | Admitting: Emergency Medicine

## 2017-02-22 DIAGNOSIS — R1084 Generalized abdominal pain: Secondary | ICD-10-CM | POA: Insufficient documentation

## 2017-02-22 DIAGNOSIS — Z79899 Other long term (current) drug therapy: Secondary | ICD-10-CM | POA: Insufficient documentation

## 2017-02-22 DIAGNOSIS — F17210 Nicotine dependence, cigarettes, uncomplicated: Secondary | ICD-10-CM | POA: Insufficient documentation

## 2017-02-22 DIAGNOSIS — B349 Viral infection, unspecified: Secondary | ICD-10-CM | POA: Insufficient documentation

## 2017-02-22 LAB — CBC
HEMATOCRIT: 44.9 % (ref 35.0–47.0)
Hemoglobin: 15.2 g/dL (ref 12.0–16.0)
MCH: 29.5 pg (ref 26.0–34.0)
MCHC: 33.8 g/dL (ref 32.0–36.0)
MCV: 87.2 fL (ref 80.0–100.0)
Platelets: 279 10*3/uL (ref 150–440)
RBC: 5.15 MIL/uL (ref 3.80–5.20)
RDW: 14.4 % (ref 11.5–14.5)
WBC: 16.8 10*3/uL — AB (ref 3.6–11.0)

## 2017-02-22 LAB — COMPREHENSIVE METABOLIC PANEL
ALT: 23 U/L (ref 14–54)
ANION GAP: 13 (ref 5–15)
AST: 24 U/L (ref 15–41)
Albumin: 4.1 g/dL (ref 3.5–5.0)
Alkaline Phosphatase: 70 U/L (ref 38–126)
BILIRUBIN TOTAL: 1 mg/dL (ref 0.3–1.2)
BUN: 16 mg/dL (ref 6–20)
CHLORIDE: 103 mmol/L (ref 101–111)
CO2: 21 mmol/L — ABNORMAL LOW (ref 22–32)
Calcium: 8.9 mg/dL (ref 8.9–10.3)
Creatinine, Ser: 0.69 mg/dL (ref 0.44–1.00)
Glucose, Bld: 115 mg/dL — ABNORMAL HIGH (ref 65–99)
POTASSIUM: 3.6 mmol/L (ref 3.5–5.1)
Sodium: 137 mmol/L (ref 135–145)
TOTAL PROTEIN: 7.3 g/dL (ref 6.5–8.1)

## 2017-02-22 LAB — LIPASE, BLOOD: Lipase: 17 U/L (ref 11–51)

## 2017-02-22 MED ORDER — SODIUM CHLORIDE 0.9 % IV BOLUS (SEPSIS)
1000.0000 mL | Freq: Once | INTRAVENOUS | Status: AC
  Administered 2017-02-22: 1000 mL via INTRAVENOUS

## 2017-02-22 MED ORDER — KETOROLAC TROMETHAMINE 30 MG/ML IJ SOLN
INTRAMUSCULAR | Status: AC
  Filled 2017-02-22: qty 1

## 2017-02-22 MED ORDER — ONDANSETRON HCL 4 MG/2ML IJ SOLN
INTRAMUSCULAR | Status: AC
  Filled 2017-02-22: qty 2

## 2017-02-22 MED ORDER — DICYCLOMINE HCL 10 MG PO CAPS
10.0000 mg | ORAL_CAPSULE | Freq: Once | ORAL | Status: AC
Start: 2017-02-22 — End: 2017-02-22
  Administered 2017-02-22: 10 mg via ORAL

## 2017-02-22 MED ORDER — DICYCLOMINE HCL 20 MG PO TABS: 20.0000 mg | ORAL_TABLET | Freq: Three times a day (TID) | ORAL | 0 refills | Status: DC | PRN

## 2017-02-22 MED ORDER — NAPROXEN 500 MG PO TABS: 500.0000 mg | ORAL_TABLET | Freq: Two times a day (BID) | ORAL | 0 refills | Status: DC

## 2017-02-22 MED ORDER — KETOROLAC TROMETHAMINE 30 MG/ML IJ SOLN
15.0000 mg | INTRAMUSCULAR | Status: AC
  Administered 2017-02-22: 15 mg via INTRAVENOUS

## 2017-02-22 MED ORDER — ONDANSETRON HCL 4 MG/2ML IJ SOLN
4.0000 mg | Freq: Once | INTRAMUSCULAR | Status: AC
  Administered 2017-02-22: 4 mg via INTRAVENOUS

## 2017-02-22 MED ORDER — ONDANSETRON 4 MG PO TBDP: 4.0000 mg | ORAL_TABLET | Freq: Three times a day (TID) | ORAL | 0 refills | Status: DC | PRN

## 2017-02-22 MED ORDER — FAMOTIDINE 20 MG PO TABS: 20.0000 mg | ORAL_TABLET | Freq: Two times a day (BID) | ORAL | 0 refills | Status: DC

## 2017-02-22 MED ORDER — DICYCLOMINE HCL 10 MG PO CAPS
ORAL_CAPSULE | ORAL | Status: AC
  Filled 2017-02-22: qty 1

## 2017-02-22 NOTE — ED Provider Notes (Signed)
[facility] Emergency Department Provider Note  ____________________________________________  Time seen: Approximately 9:23 PM  I have reviewed the triage vital signs and the nursing notes.   HISTORY  Chief Complaint Emesis    HPI Christina Marquez is a 31 y.o. female who complains of generalized abdominal pain started at about 11 AM today. She is awakened this morning at 4 AM with vomiting. She's had subjective fevers chills and sweats all day as well. No diarrhea or constipation. No blood in the vomit. No syncope. No chest pain or shortness of breath. Her 2 kids have been sick with similar symptoms over the past week, first her daughter a week ago, and then her son 2 days ago. Pain is nonradiating, no aggravating or alleviating factors, intermittent and colicky and crampy. Moderate intensity.     History reviewed. No pertinent past medical history.   There are no active problems to display for this patient.    Past Surgical History:  Procedure Laterality Date  . c section    . LEEP       Prior to Admission medications   Medication Sig Start Date End Date Taking? Authorizing Provider  dicyclomine (BENTYL) 20 MG tablet Take 1 tablet (20 mg total) by mouth 3 (three) times daily as needed for spasms. 02/22/17   Sharman Cheek, MD  famotidine (PEPCID) 20 MG tablet Take 1 tablet (20 mg total) by mouth 2 (two) times daily. 02/22/17   Sharman Cheek, MD  ibuprofen (ADVIL,MOTRIN) 600 MG tablet Take 1 tablet (600 mg total) by mouth every 8 (eight) hours as needed. 05/17/15   Tommi Rumps, PA-C  levofloxacin (LEVAQUIN) 750 MG tablet Take 1 tablet (750 mg total) by mouth daily. 12/31/14   Irean Hong, MD  magic mouthwash SOLN Take 5 mLs by mouth 3 (three) times daily as needed for mouth pain. 12/31/14   Irean Hong, MD  naproxen (NAPROSYN) 500 MG tablet Take 1 tablet (500 mg total) by mouth 2 (two) times daily with a meal. 02/22/17   Sharman Cheek, MD   ondansetron (ZOFRAN ODT) 4 MG disintegrating tablet Take 1 tablet (4 mg total) by mouth every 8 (eight) hours as needed for nausea or vomiting. 02/22/17   Sharman Cheek, MD  oxyCODONE-acetaminophen (PERCOCET) 5-325 MG tablet Take 2 tablets by mouth every 6 (six) hours as needed for moderate pain or severe pain. 10/27/15   Emily Filbert, MD     Allergies Patient has no known allergies.   No family history on file.  Social History Social History   Tobacco Use  . Smoking status: Current Every Day Smoker  . Smokeless tobacco: Never Used  Substance Use Topics  . Alcohol use: Yes  . Drug use: Yes    Types: Marijuana    Review of Systems  Constitutional:   Positive fever and chills  ENT:   No sore throat. No rhinorrhea. Cardiovascular:   No chest pain or syncope. Respiratory:   No dyspnea or cough. Gastrointestinal:   Positive as above for abdominal pain and vomiting.  Musculoskeletal:   Negative for focal pain or swelling All other systems reviewed and are negative except as documented above in ROS and HPI.  ____________________________________________   PHYSICAL EXAM:  VITAL SIGNS: ED Triage Vitals  Enc Vitals Group     BP 02/22/17 1801 (!) 158/90     Pulse Rate 02/22/17 1801 (!) 126     Resp 02/22/17 1801 20     Temp 02/22/17 1801  99.2 F (37.3 C)     Temp Source 02/22/17 1801 Oral     SpO2 02/22/17 1801 100 %     Weight 02/22/17 1802 175 lb (79.4 kg)     Height --      Head Circumference --      Peak Flow --      Pain Score 02/22/17 1801 10     Pain Loc --      Pain Edu? --      Excl. in GC? --     Vital signs reviewed, nursing assessments reviewed.   Constitutional:   Alert and oriented. Well appearing and in no distress. Eyes:   No scleral icterus.  EOMI. No nystagmus. No conjunctival pallor. PERRL. ENT   Head:   Normocephalic and atraumatic.   Nose:   No congestion/rhinnorhea.    Mouth/Throat:   Dry mucous membranes, no pharyngeal  erythema. No peritonsillar mass.    Neck:   No meningismus. Full ROM. Hematological/Lymphatic/Immunilogical:   No cervical lymphadenopathy. Cardiovascular:   RRR. Symmetric bilateral radial and DP pulses.  No murmurs.  Respiratory:   Normal respiratory effort without tachypnea/retractions. Breath sounds are clear and equal bilaterally. No wheezes/rales/rhonchi. Gastrointestinal:   Soft and nontender. Non distended. There is no CVA tenderness.  No rebound, rigidity, or guarding. Genitourinary:   deferred Musculoskeletal:   Normal range of motion in all extremities. No joint effusions.  No lower extremity tenderness.  No edema. Neurologic:   Normal speech and language.  Motor grossly intact. No gross focal neurologic deficits are appreciated.  Skin:    Skin is warm, dry and intact. No rash noted.  No petechiae, purpura, or bullae.  ____________________________________________    LABS (pertinent positives/negatives) (all labs ordered are listed, but only abnormal results are displayed) Labs Reviewed  COMPREHENSIVE METABOLIC PANEL - Abnormal; Notable for the following components:      Result Value   CO2 21 (*)    Glucose, Bld 115 (*)    All other components within normal limits  CBC - Abnormal; Notable for the following components:   WBC 16.8 (*)    All other components within normal limits  LIPASE, BLOOD  URINALYSIS, COMPLETE (UACMP) WITH MICROSCOPIC  POC URINE PREG, ED   ____________________________________________   EKG    ____________________________________________    RADIOLOGY  No results found.  ____________________________________________   PROCEDURES Procedures  ____________________________________________     CLINICAL IMPRESSION / ASSESSMENT AND PLAN / ED COURSE  Pertinent labs & imaging results that were available during my care of the patient were reviewed by me and considered in my medical decision making (see chart for  details).   Considering the patient's symptoms, medical history, and physical examination today, I have low suspicion for cholecystitis or biliary pathology, pancreatitis, perforation or bowel obstruction, hernia, intra-abdominal abscess, AAA or dissection, volvulus or intussusception, mesenteric ischemia, or appendicitis. She appears dehydrated, and with her initial tachycardia she was given IV fluids.    Clinical Course as of Feb 22 2122  Tue Feb 22, 2017  2110 Hr 90, feels better after IVF. Will give toradol, zofran, bentyl, po trial, plan DC. Presentation highly c/w viral syndrome. Plan to DC home, f/u pcp. Work note.   [PS]    Clinical Course User Index [PS] Sharman CheekStafford, Orene Abbasi, MD     ____________________________________________   FINAL CLINICAL IMPRESSION(S) / ED DIAGNOSES    Final diagnoses:  Viral syndrome  Generalized abdominal pain      This SmartLink is deprecated.  Use AVSMEDLIST instead to display the medication list for a patient.   Portions of this note were generated with dragon dictation software. Dictation errors may occur despite best attempts at proofreading.    Sharman CheekStafford, Nashid Pellum, MD 02/22/17 2128

## 2017-02-22 NOTE — ED Triage Notes (Signed)
Pt reports emesis since 5AM, generalized abdominal cramping. Pt alert and oriented X4, active, cooperative, pt in NAD. RR even and unlabored, color WNL.

## 2017-04-22 DIAGNOSIS — E669 Obesity, unspecified: Secondary | ICD-10-CM | POA: Insufficient documentation

## 2017-04-26 DIAGNOSIS — Z62810 Personal history of physical and sexual abuse in childhood: Secondary | ICD-10-CM | POA: Insufficient documentation

## 2017-10-11 ENCOUNTER — Encounter: Payer: Self-pay | Admitting: Emergency Medicine

## 2017-10-11 ENCOUNTER — Emergency Department
Admission: EM | Admit: 2017-10-11 | Discharge: 2017-10-11 | Disposition: A | Discharge status: Home / Self Care | Payer: Self-pay | Attending: Emergency Medicine | Admitting: Emergency Medicine

## 2017-10-11 DIAGNOSIS — Z79899 Other long term (current) drug therapy: Secondary | ICD-10-CM | POA: Insufficient documentation

## 2017-10-11 DIAGNOSIS — F17210 Nicotine dependence, cigarettes, uncomplicated: Secondary | ICD-10-CM | POA: Insufficient documentation

## 2017-10-11 DIAGNOSIS — H1032 Unspecified acute conjunctivitis, left eye: Secondary | ICD-10-CM | POA: Insufficient documentation

## 2017-10-11 MED ORDER — FLUORESCEIN SODIUM 1 MG OP STRP
1.0000 | ORAL_STRIP | Freq: Once | OPHTHALMIC | Status: AC
  Administered 2017-10-11: 1 via OPHTHALMIC
  Filled 2017-10-11: qty 1

## 2017-10-11 MED ORDER — EYE WASH OPHTH SOLN
1.0000 [drp] | OPHTHALMIC | Status: DC | PRN
  Administered 2017-10-11: 1 [drp] via OPHTHALMIC
  Filled 2017-10-11: qty 118

## 2017-10-11 MED ORDER — TETRACAINE HCL 0.5 % OP SOLN
2.0000 [drp] | Freq: Once | OPHTHALMIC | Status: AC
  Administered 2017-10-11: 2 [drp] via OPHTHALMIC
  Filled 2017-10-11: qty 4

## 2017-10-11 MED ORDER — TOBRAMYCIN 0.3 % OP SOLN: 2.0000 [drp] | OPHTHALMIC | 0 refills | Status: DC

## 2017-10-11 NOTE — ED Triage Notes (Signed)
Pt c/o of eye swelling, blurred vision and scratchy feeling to the left eye since Sunday. Pt reports she rubbed left eye and when she did she scratched the eye with her nail and since symptoms have occurred.

## 2017-10-11 NOTE — ED Provider Notes (Signed)
[facility] Emergency Department Provider Note  ____________________________________________   First MD Initiated Contact with Patient 10/11/17 2108     (approximate)  I have reviewed the triage vital signs and the nursing notes.   HISTORY  Chief Complaint Eye Problem    HPI Christina Marquez is a 32 y.o. female presents emergency department complaining of left eye itching and swelling.  States she thinks she might of scratched her eye yesterday.  The eye has become more red and swollen and her lid is also swollen.  She denies any fever or chills.  She states her vision is a little blurry  History reviewed. No pertinent past medical history.  There are no active problems to display for this patient.   Past Surgical History:  Procedure Laterality Date  . c section    . LEEP      Prior to Admission medications   Medication Sig Start Date End Date Taking? Authorizing Provider  dicyclomine (BENTYL) 20 MG tablet Take 1 tablet (20 mg total) by mouth 3 (three) times daily as needed for spasms. 02/22/17   Sharman CheekStafford, Phillip, MD  famotidine (PEPCID) 20 MG tablet Take 1 tablet (20 mg total) by mouth 2 (two) times daily. 02/22/17   Sharman CheekStafford, Phillip, MD  ibuprofen (ADVIL,MOTRIN) 600 MG tablet Take 1 tablet (600 mg total) by mouth every 8 (eight) hours as needed. 05/17/15   Tommi RumpsSummers, Rhonda L, PA-C  levofloxacin (LEVAQUIN) 750 MG tablet Take 1 tablet (750 mg total) by mouth daily. 12/31/14   Irean HongSung, Jade J, MD  magic mouthwash SOLN Take 5 mLs by mouth 3 (three) times daily as needed for mouth pain. 12/31/14   Irean HongSung, Jade J, MD  naproxen (NAPROSYN) 500 MG tablet Take 1 tablet (500 mg total) by mouth 2 (two) times daily with a meal. 02/22/17   Sharman CheekStafford, Phillip, MD  ondansetron (ZOFRAN ODT) 4 MG disintegrating tablet Take 1 tablet (4 mg total) by mouth every 8 (eight) hours as needed for nausea or vomiting. 02/22/17   Sharman CheekStafford, Phillip, MD  oxyCODONE-acetaminophen (PERCOCET)  5-325 MG tablet Take 2 tablets by mouth every 6 (six) hours as needed for moderate pain or severe pain. 10/27/15   Emily FilbertWilliams, Jonathan E, MD  tobramycin (TOBREX) 0.3 % ophthalmic solution Place 2 drops into the left eye every 4 (four) hours. 10/11/17   Faythe GheeFisher, Tomeca Helm W, PA-C    Allergies Patient has no known allergies.  History reviewed. No pertinent family history.  Social History Social History   Tobacco Use  . Smoking status: Current Every Day Smoker  . Smokeless tobacco: Never Used  Substance Use Topics  . Alcohol use: Yes  . Drug use: Yes    Types: Marijuana    Review of Systems  Constitutional: No fever/chills Eyes: Positive for itchy swollen eye with some blurred vision ENT: No sore throat. Respiratory: Denies cough Genitourinary: Negative for dysuria. Musculoskeletal: Negative for back pain. Skin: Negative for rash.    ____________________________________________   PHYSICAL EXAM:  VITAL SIGNS: ED Triage Vitals [10/11/17 2052]  Enc Vitals Group     BP (!) 137/97     Pulse Rate 71     Resp 17     Temp 98.1 F (36.7 C)     Temp Source Oral     SpO2 100 %     Weight 175 lb (79.4 kg)     Height      Head Circumference      Peak Flow  Pain Score      Pain Loc      Pain Edu?      Excl. in GC?     Constitutional: Alert and oriented. Well appearing and in no acute distress. Eyes: Conjunctiva of the left eye is injected.  The left upper eyelid is swollen.  There is a clear drainage noted.  Tetracaine 2 drops to the left eye, fluorscein stain to left eye, no fb or abrasion noted, no dye uptake is noted.  Eye rinsed with eye wash.  Head: Atraumatic. Nose: No congestion/rhinnorhea. Mouth/Throat: Mucous membranes are moist.   Cardiovascular: Normal rate, regular rhythm. Respiratory: Normal respiratory effort.  No retractions GU: deferred Musculoskeletal: FROM all extremities, warm and well perfused Neurologic:  Normal speech and language.  Skin:  Skin is  warm, dry and intact. No rash noted. Psychiatric: Mood and affect are normal. Speech and behavior are normal.  ____________________________________________   LABS (all labs ordered are listed, but only abnormal results are displayed)  Labs Reviewed - No data to display ____________________________________________   ____________________________________________  RADIOLOGY    ____________________________________________   PROCEDURES  Procedure(s) performed: No  Procedures    ____________________________________________   INITIAL IMPRESSION / ASSESSMENT AND PLAN / ED COURSE  Pertinent labs & imaging results that were available during my care of the patient were reviewed by me and considered in my medical decision making (see chart for details).  Patient is a 32 year old female presents emergency department complaining of left eye redness, swelling, itching and possibly a scratch to the eye from her fingernails.  On physical exam the left eye has injected conjunctiva, the left upper lid is swollen, there is a clearish drainage noted.  Tetracaine and fluorscein stain were applied.  There is no dye uptake, corneal abrasion, or foreign body noted.  Explained the exam findings to the patient.  She was given a prescription for tobramycin for conjunctivitis.  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  She is to follow-up with her regular doctor or [facility] if not better in 3 days.  She states she understands will comply with our instructions.  She was discharged in stable condition     As part of my medical decision making, I reviewed the following data within the electronic MEDICAL RECORD NUMBER Nursing notes reviewed and incorporated, Old chart reviewed, Notes from prior ED visits and [place] Controlled Substance Database  ____________________________________________   FINAL CLINICAL IMPRESSION(S) / ED DIAGNOSES  Final diagnoses:  Acute bacterial conjunctivitis  of left eye      NEW MEDICATIONS STARTED DURING THIS VISIT:  New Prescriptions   TOBRAMYCIN (TOBREX) 0.3 % OPHTHALMIC SOLUTION    Place 2 drops into the left eye every 4 (four) hours.     Note:  This document was prepared using Dragon voice recognition software and may include unintentional dictation errors.    Faythe Ghee, PA-C 10/11/17 2135    Phineas Semen, MD 10/11/17 2215

## 2017-10-11 NOTE — ED Notes (Signed)
Pt states has allergies and was itching left eye and then it started swelling. Pt states that she thought she scratched the eye. Pt states she had some blurry vision.

## 2017-10-11 NOTE — Discharge Instructions (Signed)
Follow-up with your regular doctor or [facility] if you are not better in 3 days.  Use medication as prescribed.  Wash your hands if you touch her eye.  Try not to rub at the eye and spread the infection.

## 2017-10-11 NOTE — ED Notes (Signed)
LEFT EYE- 20/50 RIGHT EYE- 20/20

## 2018-01-06 LAB — HIV ANTIBODY (ROUTINE TESTING W REFLEX): HIV 1&2 Ab, 4th Generation: NONREACTIVE

## 2018-04-26 ENCOUNTER — Other Ambulatory Visit: Payer: Self-pay

## 2018-04-26 ENCOUNTER — Encounter: Payer: Self-pay | Admitting: Emergency Medicine

## 2018-04-26 ENCOUNTER — Emergency Department: Payer: Self-pay

## 2018-04-26 ENCOUNTER — Emergency Department
Admission: EM | Admit: 2018-04-26 | Discharge: 2018-04-26 | Disposition: A | Discharge status: Home / Self Care | Payer: Self-pay | Attending: Emergency Medicine | Admitting: Emergency Medicine

## 2018-04-26 DIAGNOSIS — F1210 Cannabis abuse, uncomplicated: Secondary | ICD-10-CM | POA: Insufficient documentation

## 2018-04-26 DIAGNOSIS — J111 Influenza due to unidentified influenza virus with other respiratory manifestations: Secondary | ICD-10-CM

## 2018-04-26 DIAGNOSIS — R05 Cough: Secondary | ICD-10-CM | POA: Insufficient documentation

## 2018-04-26 DIAGNOSIS — J0100 Acute maxillary sinusitis, unspecified: Secondary | ICD-10-CM

## 2018-04-26 DIAGNOSIS — R69 Illness, unspecified: Secondary | ICD-10-CM

## 2018-04-26 DIAGNOSIS — M791 Myalgia, unspecified site: Secondary | ICD-10-CM | POA: Insufficient documentation

## 2018-04-26 DIAGNOSIS — F17210 Nicotine dependence, cigarettes, uncomplicated: Secondary | ICD-10-CM | POA: Insufficient documentation

## 2018-04-26 IMAGING — CR DG CHEST 2V
1 series · 2 of 2 positions shown · non-contrast
Comparison: None.

CLINICAL DATA: Cough and fever

EXAM:
CHEST - 2 VIEW

[Series 1: dg chest 2 view · 0.14mm/px · 2 of 2 slices shown]
[im 1/2]
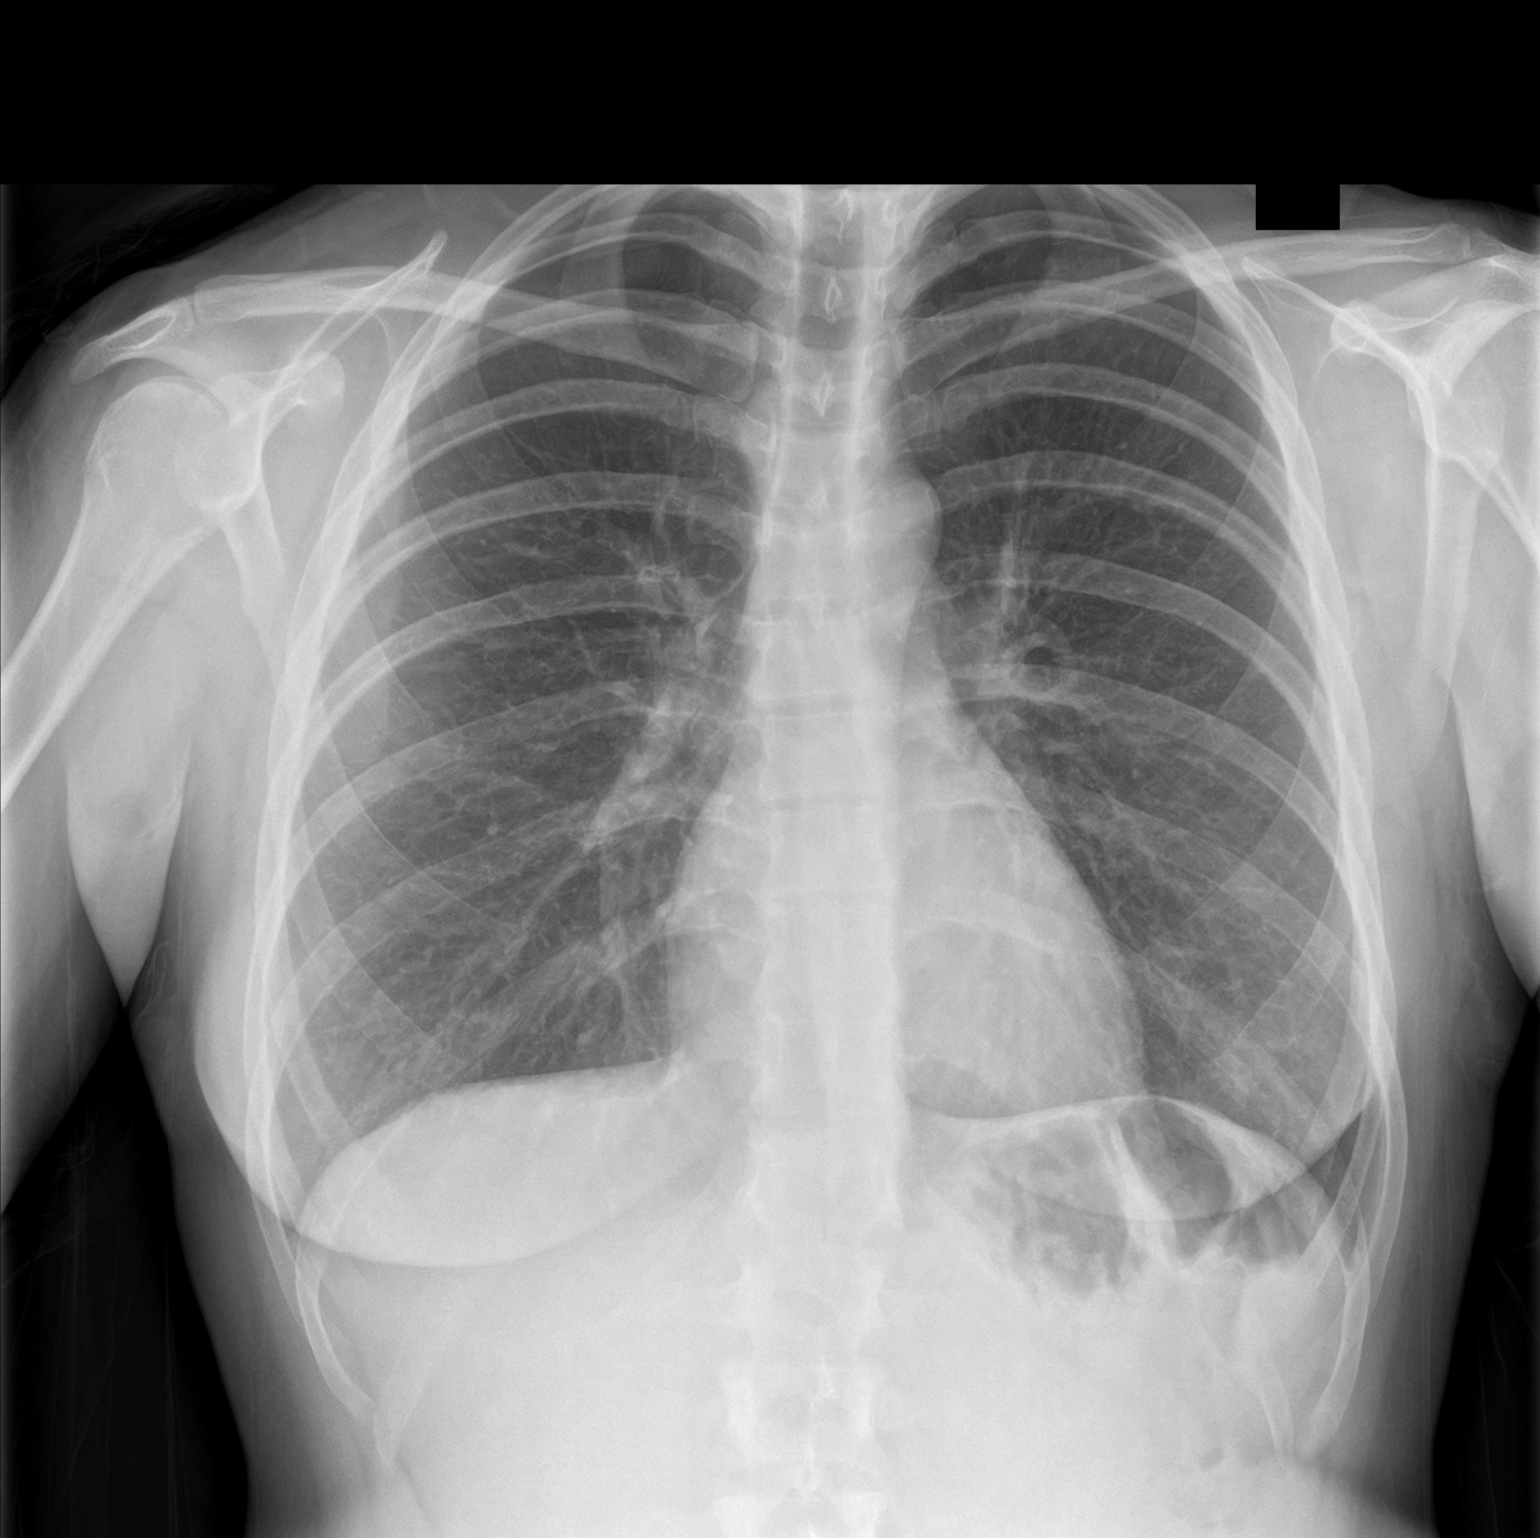
[im 2/2]
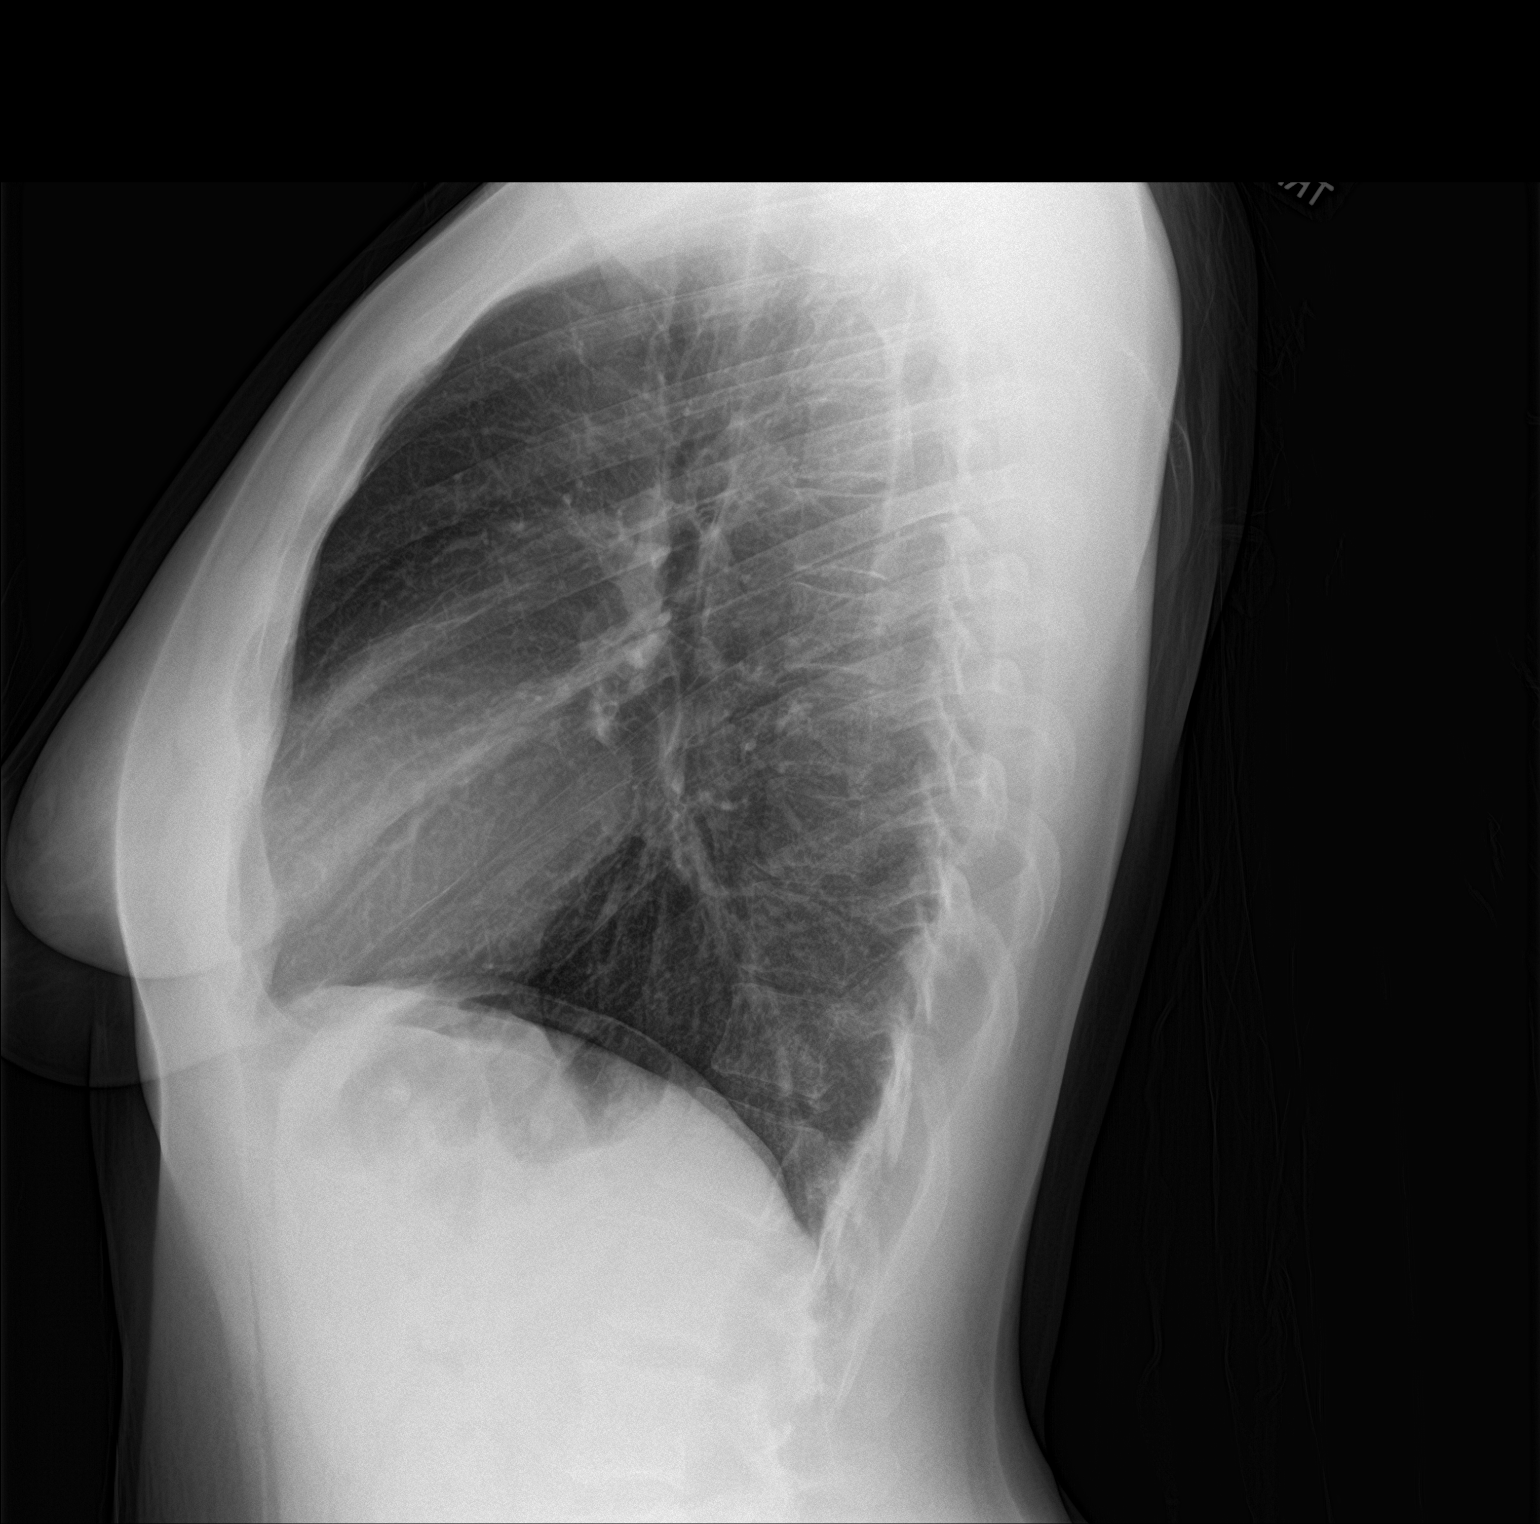

[2 of 2 positions shown; findings below may reference images not displayed]

FINDINGS: The heart size and mediastinal contours are within normal limits.
Both lungs are clear. The visualized skeletal structures are
unremarkable.
IMPRESSION: No active cardiopulmonary disease.

## 2018-04-26 MED ORDER — AZITHROMYCIN 250 MG PO TABS: ORAL_TABLET | ORAL | 0 refills | Status: DC

## 2018-04-26 MED ORDER — BENZONATATE 200 MG PO CAPS: 200.0000 mg | ORAL_CAPSULE | Freq: Three times a day (TID) | ORAL | 0 refills | Status: DC | PRN

## 2018-04-26 NOTE — ED Provider Notes (Signed)
[facility] Emergency Department Provider Note  ____________________________________________   First MD Initiated Contact with Patient 04/26/18 1913     (approximate)  I have reviewed the triage vital signs and the nursing notes.   HISTORY  Chief Complaint Cough and Nasal Congestion    HPI Sreeja Brandell is a 33 y.o. female presents emergency department with flulike symptoms, patient is complained of fever, chills, body aches.  cough, sore throat, denies vomiting, denies diarrhea; denies chest pain or sob.  Sx for 7 days.  She states her daughter was diagnosed with the flu last week.  She states her symptoms of continued while the children have gotten well.  States she is blowing out yellow/green and brown mucus.  She is also having a productive cough with brown mucus.   History reviewed. No pertinent past medical history.  There are no active problems to display for this patient.   Past Surgical History:  Procedure Laterality Date  . c section    . LEEP      Prior to Admission medications   Medication Sig Start Date End Date Taking? Authorizing Provider  azithromycin (ZITHROMAX Z-PAK) 250 MG tablet 2 pills today then 1 pill a day for 4 days 04/26/18   Sherrie Mustache Roselyn Bering, PA-C  benzonatate (TESSALON) 200 MG capsule Take 1 capsule (200 mg total) by mouth 3 (three) times daily as needed for cough. 04/26/18   Faythe Ghee, PA-C    Allergies Patient has no known allergies.  No family history on file.  Social History Social History   Tobacco Use  . Smoking status: Current Every Day Smoker    Packs/day: 0.50    Types: Cigarettes  . Smokeless tobacco: Never Used  Substance Use Topics  . Alcohol use: Yes  . Drug use: Yes    Types: Marijuana    Review of Systems  Constitutional: Positive fever/chills Eyes: No visual changes. ENT: Positive sore throat. Respiratory: Positive cough Genitourinary: Negative for dysuria. Musculoskeletal: Negative  for back pain. Skin: Negative for rash.    ____________________________________________   PHYSICAL EXAM:  VITAL SIGNS: ED Triage Vitals  Enc Vitals Group     BP 04/26/18 1751 122/90     Pulse Rate 04/26/18 1751 90     Resp 04/26/18 1751 16     Temp 04/26/18 1751 98.7 F (37.1 C)     Temp Source 04/26/18 1751 Oral     SpO2 04/26/18 1751 97 %     Weight 04/26/18 1752 160 lb (72.6 kg)     Height 04/26/18 1752 5\' 6"  (1.676 m)     Head Circumference --      Peak Flow --      Pain Score 04/26/18 1751 0     Pain Loc --      Pain Edu? --      Excl. in GC? --     Constitutional: Alert and oriented. Well appearing and in no acute distress. Eyes: Conjunctivae are normal.  Head: Atraumatic. Nose: No congestion/rhinnorhea. Mouth/Throat: Mucous membranes are moist.   Neck:  supple no lymphadenopathy noted Cardiovascular: Normal rate, regular rhythm. Heart sounds are normal Respiratory: Normal respiratory effort.  No retractions, lungs c t a  GU: deferred Musculoskeletal: FROM all extremities, warm and well perfused Neurologic:  Normal speech and language.  Skin:  Skin is warm, dry and intact. No rash noted. Psychiatric: Mood and affect are normal. Speech and behavior are normal.  ____________________________________________   LABS (all labs ordered  are listed, but only abnormal results are displayed)  Labs Reviewed - No data to display ____________________________________________   ____________________________________________  RADIOLOGY  Chest x-ray is negative for pneumonia  ____________________________________________   PROCEDURES  Procedure(s) performed: No  Procedures    ____________________________________________   INITIAL IMPRESSION / ASSESSMENT AND PLAN / ED COURSE  Pertinent labs & imaging results that were available during my care of the patient were reviewed by me and considered in my medical decision making (see chart for details).   Patient  is a 33 year old female presents emergency department complaining of flulike symptoms.  Physical exam shows a dry hacking cough.  Remainder exam is unremarkable.  Chest x-ray is negative.  Flu test was not ordered as the patient's child had a positive flu test last week and her symptoms have been ongoing for 1 week.  Splane to her that she most likely did have influenza.  At this time I am more concerned due to the discolored mucus along with the cough and congestion worsening.  Explained to her that the chest x-ray was negative.  She was given a prescription for Z-Pak and Tessalon Perles.  She is to follow-up with her regular doctor for a physical.  She states she understands will comply.  She is discharged in stable condition.     As part of my medical decision making, I reviewed the following data within the electronic MEDICAL RECORD NUMBER Nursing notes reviewed and incorporated, Old chart reviewed, Radiograph reviewed chest x-ray is negative, Notes from prior ED visits and [place] Controlled Substance Database  ____________________________________________   FINAL CLINICAL IMPRESSION(S) / ED DIAGNOSES  Final diagnoses:  Influenza-like illness  Acute maxillary sinusitis, recurrence not specified      NEW MEDICATIONS STARTED DURING THIS VISIT:  New Prescriptions   AZITHROMYCIN (ZITHROMAX Z-PAK) 250 MG TABLET    2 pills today then 1 pill a day for 4 days   BENZONATATE (TESSALON) 200 MG CAPSULE    Take 1 capsule (200 mg total) by mouth 3 (three) times daily as needed for cough.     Note:  This document was prepared using Dragon voice recognition software and may include unintentional dictation errors.    Faythe Ghee, PA-C 04/26/18 2007    Pershing Proud Myra Rude, MD 04/26/18 [phone]

## 2018-04-26 NOTE — ED Triage Notes (Signed)
Pt in via POV, reports cough, congestion, fever x approximately on week.  Pt states fever has since resolved but she still doesn't feel any better.  Vitals WDL, NAD noted at this time.

## 2018-04-26 NOTE — Discharge Instructions (Addendum)
Follow-up with your regular doctor.  Please call for appointment due to the decreased appetite.  For the influenza symptoms please take over-the-counter TheraFlu.  You have been given a prescription for Z-Pak and Tessalon Perles.  Drink plenty fluids.  Tylenol/ibuprofen as needed for fever and body aches.

## 2018-05-18 ENCOUNTER — Emergency Department
Admission: EM | Admit: 2018-05-18 | Discharge: 2018-05-18 | Disposition: A | Discharge status: Home / Self Care | Payer: Self-pay | Attending: Emergency Medicine | Admitting: Emergency Medicine

## 2018-05-18 DIAGNOSIS — R1030 Lower abdominal pain, unspecified: Secondary | ICD-10-CM | POA: Insufficient documentation

## 2018-05-18 DIAGNOSIS — Z5321 Procedure and treatment not carried out due to patient leaving prior to being seen by health care provider: Secondary | ICD-10-CM | POA: Insufficient documentation

## 2018-05-18 NOTE — ED Notes (Signed)
First nurse note: presents via [facility] from home c/o lower abd pain/pressure radiating into back, 6/10. x1 episode diarrhea 0300 today.

## 2018-05-18 NOTE — ED Notes (Signed)
Informed by EDT Atkins that pt stated she is going home and does not want to wait to be evaluated by MD. EDT states pt was called multiple times for triage and did not respond.

## 2018-05-18 NOTE — ED Notes (Signed)
Pt called for a second time in waiting room. This EDT started working on pt vitals in triage room 2 when pt stated that she couldn't wait and wanted to be bumped up because she cam EMS. This EDT explained triage process and finished the triage process and noted pt vitals.

## 2018-05-26 DIAGNOSIS — Z62810 Personal history of physical and sexual abuse in childhood: Secondary | ICD-10-CM

## 2018-05-26 DIAGNOSIS — E669 Obesity, unspecified: Secondary | ICD-10-CM

## 2018-10-03 ENCOUNTER — Ambulatory Visit: Payer: Self-pay

## 2018-10-03 ENCOUNTER — Other Ambulatory Visit: Payer: Self-pay

## 2018-10-03 ENCOUNTER — Other Ambulatory Visit: Payer: Self-pay | Admitting: Family Medicine

## 2018-10-03 DIAGNOSIS — F1210 Cannabis abuse, uncomplicated: Secondary | ICD-10-CM | POA: Insufficient documentation

## 2018-10-03 DIAGNOSIS — B9689 Other specified bacterial agents as the cause of diseases classified elsewhere: Secondary | ICD-10-CM | POA: Insufficient documentation

## 2018-10-03 DIAGNOSIS — F1290 Cannabis use, unspecified, uncomplicated: Secondary | ICD-10-CM

## 2018-10-03 DIAGNOSIS — A64 Unspecified sexually transmitted disease: Secondary | ICD-10-CM

## 2018-10-03 DIAGNOSIS — F17200 Nicotine dependence, unspecified, uncomplicated: Secondary | ICD-10-CM

## 2018-10-03 DIAGNOSIS — F17210 Nicotine dependence, cigarettes, uncomplicated: Secondary | ICD-10-CM

## 2018-10-03 DIAGNOSIS — N760 Acute vaginitis: Secondary | ICD-10-CM

## 2018-10-03 LAB — WET PREP FOR TRICH, YEAST, CLUE
Trichomonas Exam: NEGATIVE
Yeast Exam: NEGATIVE

## 2018-10-03 MED ORDER — METRONIDAZOLE 500 MG PO TABS: 500.0000 mg | ORAL_TABLET | Freq: Two times a day (BID) | ORAL | 0 refills | Status: AC

## 2018-10-03 NOTE — Progress Notes (Signed)
Wet mount reviewed by provider; per verbal by C.Marzetta Board, FNP, pt treated for BV per standing order.
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
## 2018-10-08 LAB — GONOCOCCUS CULTURE

## 2018-10-16 ENCOUNTER — Telehealth: Payer: Self-pay | Admitting: Family Medicine

## 2018-10-16 NOTE — Telephone Encounter (Signed)
Pt states she received phone call today from [facility] stating she was positive for Hep C. Pt asking if she had been tested before; pt knew password; pt counseled that I do not see that ordered in [facility] Centricity system in the past. Pt states she has access to Epic and will look there for any previous testing for Hep C.

## 2018-11-02 ENCOUNTER — Telehealth: Payer: Self-pay

## 2018-11-02 ENCOUNTER — Telehealth: Payer: Self-pay | Admitting: Nurse Practitioner

## 2018-11-02 ENCOUNTER — Encounter: Payer: Self-pay | Admitting: Nurse Practitioner

## 2018-11-02 DIAGNOSIS — Z205 Contact with and (suspected) exposure to viral hepatitis: Secondary | ICD-10-CM | POA: Insufficient documentation

## 2018-11-02 HISTORY — DX: Contact with and (suspected) exposure to viral hepatitis: Z20.5

## 2018-11-16 ENCOUNTER — Ambulatory Visit (INDEPENDENT_AMBULATORY_CARE_PROVIDER_SITE_OTHER): Payer: Medicaid Other | Admitting: Physician Assistant

## 2018-11-16 ENCOUNTER — Encounter: Payer: Self-pay | Admitting: Physician Assistant

## 2018-11-16 ENCOUNTER — Other Ambulatory Visit: Payer: Self-pay

## 2018-11-16 ENCOUNTER — Other Ambulatory Visit: Payer: Self-pay | Admitting: Physician Assistant

## 2018-11-16 VITALS — BP 114/69 | HR 77 | Temp 97.5°F | Ht 66.0 in | Wt 166.0 lb

## 2018-11-16 DIAGNOSIS — R7689 Other specified abnormal immunological findings in serum: Secondary | ICD-10-CM

## 2018-11-16 DIAGNOSIS — F1010 Alcohol abuse, uncomplicated: Secondary | ICD-10-CM | POA: Insufficient documentation

## 2018-11-16 DIAGNOSIS — F339 Major depressive disorder, recurrent, unspecified: Secondary | ICD-10-CM

## 2018-11-16 DIAGNOSIS — R768 Other specified abnormal immunological findings in serum: Secondary | ICD-10-CM

## 2018-11-16 DIAGNOSIS — F419 Anxiety disorder, unspecified: Secondary | ICD-10-CM

## 2018-11-16 HISTORY — DX: Other specified abnormal immunological findings in serum: R76.89

## 2018-11-16 MED ORDER — SERTRALINE HCL 50 MG PO TABS: 50.0000 mg | ORAL_TABLET | Freq: Every day | ORAL | 0 refills | Status: DC
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
## 2018-11-16 NOTE — Patient Instructions (Signed)
Depression Screening Depression screening is a tool that your health care provider can use to learn if you have symptoms of depression. Depression is a common condition with many symptoms that are also often found in other conditions. Depression is treatable, but it must first be diagnosed. You may not know that certain feelings, thoughts, and behaviors that you are having can be symptoms of depression. Taking a depression screening test can help you and your health care provider decide if you need more assessment, or if you should be referred to a mental health care provider. What are the screening tests?  You may have a physical exam to see if another condition is affecting your mental health. You may have a blood or urine sample taken during the physical exam.  You may be interviewed using a screening tool that was developed from research, such as one of these: ? Patient Health Questionnaire (PHQ). This is a set of either 2 or 9 questions. A health care provider who has been trained to score this screening test uses a guide to assess if your symptoms suggest that you may have depression. ? Hamilton Depression Rating Scale (HAM-D). This is a set of either 17 or 24 questions. You may be asked to take it again during or after your treatment, to see if your depression has gotten better. ? Beck Depression Inventory (BDI). This is a set of 21 multiple choice questions. Your health care provider scores your answers to assess:  Your level of depression, ranging from mild to severe.  Your response to treatment.  Your health care provider may talk with you about your daily activities, such as eating, sleeping, work, and recreation, and ask if you have had any changes in activity.  Your health care provider may ask you to see a mental health specialist, such as a psychiatrist or psychologist, for more evaluation. Who should be screened for depression?   All adults, including adults with a family history  of a mental health disorder.  Adolescents who are 12-18 years old.  People who are recovering from a myocardial infarction (MI).  Pregnant women, or women who have given birth.  People who have a long-term (chronic) illness.  Anyone who has been diagnosed with another type of a mental health disorder.  Anyone who has symptoms that could show depression. What do my results mean? Your health care provider will review the results of your depression screening, physical exam, and lab tests. Positive screens suggest that you may have depression. Screening is the first step in getting the care that you may need. It is up to you to get your screening results. Ask your health care provider, or the department that is doing your screening tests, when your results will be ready. Talk with your health care provider about your results and diagnosis. A diagnosis of depression is made using the Diagnostic and Statistical Manual of Mental Disorders (DSM-V). This is a book that lists the number and type of symptoms that must be present for a health care provider to give a specific diagnosis.  Your health care provider may work with you to treat your symptoms of depression, or your health care provider may help you find a mental health provider who can assess, diagnose, and treat your depression. Get help right away if:  You have thoughts about hurting yourself or others. If you ever feel like you may hurt yourself or others, or have thoughts about taking your own life, get help right away. You   can go to your nearest emergency department or call:  Your local emergency services (911 in the [place]  A suicide crisis helpline, such as the National Suicide Prevention Lifeline at 1-800-273-8255. This is open 24 hours a day. Summary  Depression screening is the first step in getting the help that you may need.  If your screening test shows symptoms of depression (is positive), your health care provider may ask  you to see a mental health provider.  Anyone who is age 12 or older should be screened for depression. This information is not intended to replace advice given to you by your health care provider. Make sure you discuss any questions you have with your health care provider. Document Released: 07/30/2016 Document Revised: 02/25/2017 Document Reviewed: 07/30/2016 Elsevier Patient Education  2020 Elsevier Inc.  

## 2018-11-17 LAB — CBC WITH DIFFERENTIAL/PLATELET
Basophils Absolute: 0.1 10*3/uL (ref 0.0–0.2)
Basos: 1 %
EOS (ABSOLUTE): 0.2 10*3/uL (ref 0.0–0.4)
Eos: 1 %
Hematocrit: 41.6 % (ref 34.0–46.6)
Hemoglobin: 14.4 g/dL (ref 11.1–15.9)
Immature Grans (Abs): 0 10*3/uL (ref 0.0–0.1)
Immature Granulocytes: 0 %
Lymphocytes Absolute: 3 10*3/uL (ref 0.7–3.1)
Lymphs: 28 %
MCH: 30 pg (ref 26.6–33.0)
MCHC: 34.6 g/dL (ref 31.5–35.7)
MCV: 87 fL (ref 79–97)
Monocytes Absolute: 0.8 10*3/uL (ref 0.1–0.9)
Monocytes: 8 %
Neutrophils Absolute: 6.7 10*3/uL (ref 1.4–7.0)
Neutrophils: 62 %
Platelets: 282 10*3/uL (ref 150–450)
RBC: 4.8 x10E6/uL (ref 3.77–5.28)
RDW: 13.1 % (ref 11.7–15.4)
WBC: 10.8 10*3/uL (ref 3.4–10.8)

## 2018-11-17 LAB — HCV RNA QUANT: Hepatitis C Quantitation: NOT DETECTED IU/mL

## 2018-11-17 LAB — COMPREHENSIVE METABOLIC PANEL
ALT: 9 IU/L (ref 0–32)
AST: 14 IU/L (ref 0–40)
Albumin/Globulin Ratio: 2.1 (ref 1.2–2.2)
Albumin: 4.2 g/dL (ref 3.8–4.8)
Alkaline Phosphatase: 63 IU/L (ref 39–117)
BUN/Creatinine Ratio: 12 (ref 9–23)
BUN: 9 mg/dL (ref 6–20)
Bilirubin Total: 0.2 mg/dL (ref 0.0–1.2)
CO2: 22 mmol/L (ref 20–29)
Calcium: 9.2 mg/dL (ref 8.7–10.2)
Chloride: 104 mmol/L (ref 96–106)
Creatinine, Ser: 0.74 mg/dL (ref 0.57–1.00)
GFR calc Af Amer: 121 mL/min/{1.73_m2} (ref >59)
GFR calc non Af Amer: 105 mL/min/{1.73_m2} (ref >59)
Globulin, Total: 2 g/dL (ref 1.5–4.5)
Glucose: 98 mg/dL (ref 65–99)
Potassium: 3.8 mmol/L (ref 3.5–5.2)
Sodium: 140 mmol/L (ref 134–144)
Total Protein: 6.2 g/dL (ref 6.0–8.5)

## 2018-11-17 LAB — HEPATITIS A ANTIBODY, TOTAL: hep A Total Ab: NEGATIVE

## 2018-11-17 LAB — HEPATITIS B SURFACE ANTIBODY,QUALITATIVE: Hep B Surface Ab, Qual: REACTIVE

## 2018-11-17 LAB — HEPATITIS C ANTIBODY: Hep C Virus Ab: 0.2 s/co ratio (ref 0.0–0.9)

## 2018-11-20 ENCOUNTER — Ambulatory Visit: Payer: Self-pay | Admitting: *Deleted

## 2018-11-20 NOTE — Chronic Care Management (AMB) (Signed)
    Care Management   Unsuccessful Call Note 11/20/2018 Name: Christina Marquez MRN: [id number] DOB: Feb 09, 1986  Patient is a 33 year old female who sees  for primary care. Carles Collet, PA-C asked the CCM team to consult the patient for Counseling and Mental Health Resources.  Referral was placed 11/16/18. Patient's last office visit was 11/16/18.     This social worker was unable to consent patient today for CM services. Patienvtanswered, however states that  She would need to call this social worker back.  This social worker's contact information provided.  (unsuccessful outreach #1).   Plan: Will follow-up within 7 business days via telephone.      Elliot Gurney, [place] Worker  [facility] [phone]

## 2018-11-21 ENCOUNTER — Other Ambulatory Visit: Payer: Self-pay

## 2018-11-27 ENCOUNTER — Ambulatory Visit: Payer: Self-pay | Admitting: *Deleted

## 2018-11-27 ENCOUNTER — Encounter: Payer: Self-pay | Admitting: *Deleted

## 2018-11-27 DIAGNOSIS — F339 Major depressive disorder, recurrent, unspecified: Secondary | ICD-10-CM

## 2018-11-27 DIAGNOSIS — F419 Anxiety disorder, unspecified: Secondary | ICD-10-CM

## 2018-11-27 NOTE — Patient Instructions (Signed)
Thank you allowing the Chronic Care Management Team to be a part of your care! It was a pleasure speaking with you today!   Christina Marquez was given information about  Care Management services today including:  1. CM service includes personalized support from designated clinical staff supervised by her physician, including individualized plan of care and coordination with other care providers 2. 24/7 contact phone numbers for assistance for urgent and routine care needs. 3. The patient may stop CCM services at any time (effective at the end of the month) by phone call to the office staff.     CCM (Chronic Care Management) Team   Suzie Portela RN, BSN Nurse Care Coordinator  [phone]  Ruben Reason PharmD  Clinical Pharmacist  [phone]   Nimrod, LCSW Clinical Social Worker [phone]  Goals Addressed   None      The patient verbalized understanding of instructions provided today and declined a print copy of patient instruction materials.   No further follow up required: mental health resources provided

## 2018-11-27 NOTE — Chronic Care Management (AMB) (Deleted)
  Care Management    Clinical Social Work General Note  11/27/2018 Name: Christina Marquez MRN: [id number] DOB: 03-15-1986  Christina Marquez is a 33 y.o. year old female who is a primary care patient of Paulene Floor. The CCM was consulted to assist the patient with Mental Health Counseling and Resources.   Ms. Mcilvain was given information about Care Management services today including:  1. CCM service includes personalized support from designated clinical staff supervised by her physician, including individualized plan of care and coordination with other care providers 2. 24/7 contact phone numbers for assistance for urgent and routine care needs. 3. The patient may stop CM services at any time (effective at the end of the month) by phone call to the office staff.  Patient did not agree to enrollment in care management services and does not wish to consider at this time.  Review of patient status, including review of consultants reports, relevant laboratory and other test results, and collaboration with appropriate care team members and the patient's provider was performed as part of comprehensive patient evaluation and provision of chronic care management services.     Outpatient Encounter Medications as of 11/27/2018  Medication Sig  . azithromycin (ZITHROMAX Z-PAK) 250 MG tablet 2 pills today then 1 pill a day for 4 days  . benzonatate (TESSALON) 200 MG capsule Take 1 capsule (200 mg total) by mouth 3 (three) times daily as needed for cough.  . sertraline (ZOLOFT) 50 MG tablet Take 1 tablet (50 mg total) by mouth daily.   No facility-administered encounter medications on file as of 11/27/2018.     Goals Addressed   None      Follow Up Plan: Client will contact this social worker in the futue if needed.  This Education officer, museum provided patient with my contact information to use in the future of needed. Patient also provided with the contact information for [facility] and [facility] if  needed in the future.       SIGNATURE

## 2018-11-27 NOTE — Chronic Care Management (AMB) (Signed)
   Care Management    Clinical Social Work General Note  11/27/2018 Name: Christina Marquez MRN: [id number] DOB: [birth year]  Christina Marquez is a 33 y.o. year old female who is a primary care patient of Christina Marquez. The CM was consulted to assist the patient with Mental Health Counseling and Resources.   Christina Marquez was given information about  Care Management services today including:  1. CM service includes personalized support from designated clinical staff supervised by her physician, including individualized plan of care and coordination with other care providers 2. 24/7 contact phone numbers for assistance for urgent and routine care needs. 3. The patient may stop CCM services at any time (effective at the end of the month) by phone call to the office staff.   Patient did not agree to enrollment in care management services and does not wish to consider at this time.   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  Review of patient status, including review of consultants reports, relevant laboratory and other test results, and collaboration with appropriate care team members and the patient's provider was performed as part of comprehensive patient evaluation and provision of chronic care management services.     Outpatient Encounter Medications as of 11/27/2018  Medication Sig  . azithromycin (ZITHROMAX Z-PAK) 250 MG tablet 2 pills today then 1 pill a day for 4 days  . benzonatate (TESSALON) 200 MG capsule Take 1 capsule (200 mg total) by mouth 3 (three) times daily as needed for cough.  . sertraline (ZOLOFT) 50 MG tablet Take 1 tablet (50 mg total) by mouth daily.   No facility-administered encounter medications on file as of 11/27/2018.     Goals Addressed   None      Follow Up Plan: Client will contact this social worker if needed in the future    This social worker provided patient with my contact number as well as the contact information for mental health agencies in the community that may be able to fit her schedule         Johnie Stadel, [place] Worker  [facility] [phone]

## 2018-11-27 NOTE — Progress Notes (Signed)
This encounter was created in error - please disregard.

## 2018-12-14 NOTE — Addendum Note (Signed)
Addended by: Cletis Media on: 12/14/2018 09:29 AM   Modules accepted: Orders

## 2018-12-18 ENCOUNTER — Ambulatory Visit: Payer: Medicaid Other | Admitting: Physician Assistant

## 2019-01-17 ENCOUNTER — Other Ambulatory Visit: Payer: Self-pay

## 2019-01-17 ENCOUNTER — Emergency Department: Payer: Self-pay

## 2019-01-17 ENCOUNTER — Emergency Department
Admission: EM | Admit: 2019-01-17 | Discharge: 2019-01-17 | Disposition: A | Discharge status: Home / Self Care | Payer: Self-pay | Attending: Emergency Medicine | Admitting: Emergency Medicine

## 2019-01-17 DIAGNOSIS — F1210 Cannabis abuse, uncomplicated: Secondary | ICD-10-CM | POA: Insufficient documentation

## 2019-01-17 DIAGNOSIS — R079 Chest pain, unspecified: Secondary | ICD-10-CM | POA: Insufficient documentation

## 2019-01-17 DIAGNOSIS — Z79899 Other long term (current) drug therapy: Secondary | ICD-10-CM | POA: Insufficient documentation

## 2019-01-17 DIAGNOSIS — F17210 Nicotine dependence, cigarettes, uncomplicated: Secondary | ICD-10-CM | POA: Insufficient documentation

## 2019-01-17 DIAGNOSIS — Y902 Blood alcohol level of 40-59 mg/100 ml: Secondary | ICD-10-CM | POA: Insufficient documentation

## 2019-01-17 LAB — COMPREHENSIVE METABOLIC PANEL
ALT: 15 U/L (ref 0–44)
AST: 14 U/L — ABNORMAL LOW (ref 15–41)
Albumin: 3.7 g/dL (ref 3.5–5.0)
Alkaline Phosphatase: 60 U/L (ref 38–126)
Anion gap: 10 (ref 5–15)
BUN: 13 mg/dL (ref 6–20)
CO2: 24 mmol/L (ref 22–32)
Calcium: 8.8 mg/dL — ABNORMAL LOW (ref 8.9–10.3)
Chloride: 107 mmol/L (ref 98–111)
Creatinine, Ser: 0.84 mg/dL (ref 0.44–1.00)
GFR calc Af Amer: >60 mL/min (ref >60)
GFR calc non Af Amer: >60 mL/min (ref >60)
Glucose, Bld: 103 mg/dL — ABNORMAL HIGH (ref 70–99)
Potassium: 3.7 mmol/L (ref 3.5–5.1)
Sodium: 141 mmol/L (ref 135–145)
Total Bilirubin: 0.4 mg/dL (ref 0.3–1.2)
Total Protein: 6.6 g/dL (ref 6.5–8.1)

## 2019-01-17 LAB — CBC WITH DIFFERENTIAL/PLATELET
Abs Immature Granulocytes: 0.09 10*3/uL — ABNORMAL HIGH (ref 0.00–0.07)
Basophils Absolute: 0.1 10*3/uL (ref 0.0–0.1)
Basophils Relative: 1 %
Eosinophils Absolute: 0.2 10*3/uL (ref 0.0–0.5)
Eosinophils Relative: 1 %
HCT: 41.8 % (ref 36.0–46.0)
Hemoglobin: 13.7 g/dL (ref 12.0–15.0)
Immature Granulocytes: 1 %
Lymphocytes Relative: 23 %
Lymphs Abs: 4.2 10*3/uL — ABNORMAL HIGH (ref 0.7–4.0)
MCH: 29.5 pg (ref 26.0–34.0)
MCHC: 32.8 g/dL (ref 30.0–36.0)
MCV: 90.1 fL (ref 80.0–100.0)
Monocytes Absolute: 1.2 10*3/uL — ABNORMAL HIGH (ref 0.1–1.0)
Monocytes Relative: 7 %
Neutro Abs: 12.1 10*3/uL — ABNORMAL HIGH (ref 1.7–7.7)
Neutrophils Relative %: 67 %
Platelets: 264 10*3/uL (ref 150–400)
RBC: 4.64 MIL/uL (ref 3.87–5.11)
RDW: 14 % (ref 11.5–15.5)
WBC: 17.8 10*3/uL — ABNORMAL HIGH (ref 4.0–10.5)
nRBC: 0 % (ref 0.0–0.2)

## 2019-01-17 LAB — URINE DRUG SCREEN, QUALITATIVE (ARMC ONLY)
Amphetamines, Ur Screen: NOT DETECTED
Barbiturates, Ur Screen: NOT DETECTED
Benzodiazepine, Ur Scrn: NOT DETECTED
Cannabinoid 50 Ng, Ur ~~LOC~~: POSITIVE — AB
Cocaine Metabolite,Ur ~~LOC~~: NOT DETECTED
MDMA (Ecstasy)Ur Screen: NOT DETECTED
Methadone Scn, Ur: NOT DETECTED
Opiate, Ur Screen: NOT DETECTED
Phencyclidine (PCP) Ur S: NOT DETECTED
Tricyclic, Ur Screen: NOT DETECTED

## 2019-01-17 LAB — MAGNESIUM: Magnesium: 2 mg/dL (ref 1.7–2.4)

## 2019-01-17 LAB — TROPONIN I (HIGH SENSITIVITY)
Troponin I (High Sensitivity): 4 ng/L (ref <18)
Troponin I (High Sensitivity): 4 ng/L (ref <18)

## 2019-01-17 LAB — LIPASE, BLOOD: Lipase: 17 U/L (ref 11–51)

## 2019-01-17 LAB — ACETAMINOPHEN LEVEL: Acetaminophen (Tylenol), Serum: <10 ug/mL — ABNORMAL LOW (ref 10–30)

## 2019-01-17 LAB — SALICYLATE LEVEL: Salicylate Lvl: <7 mg/dL (ref 2.8–30.0)

## 2019-01-17 LAB — ETHANOL: Alcohol, Ethyl (B): 55 mg/dL — ABNORMAL HIGH (ref <10)

## 2019-01-17 LAB — POCT PREGNANCY, URINE: Preg Test, Ur: NEGATIVE

## 2019-01-17 IMAGING — CR DG CHEST 2V
2 series · 2 of 2 positions shown · non-contrast
Comparison: [DATE]

CLINICAL DATA: Chest pain.

EXAM:
CHEST - 2 VIEW

[chest pa]
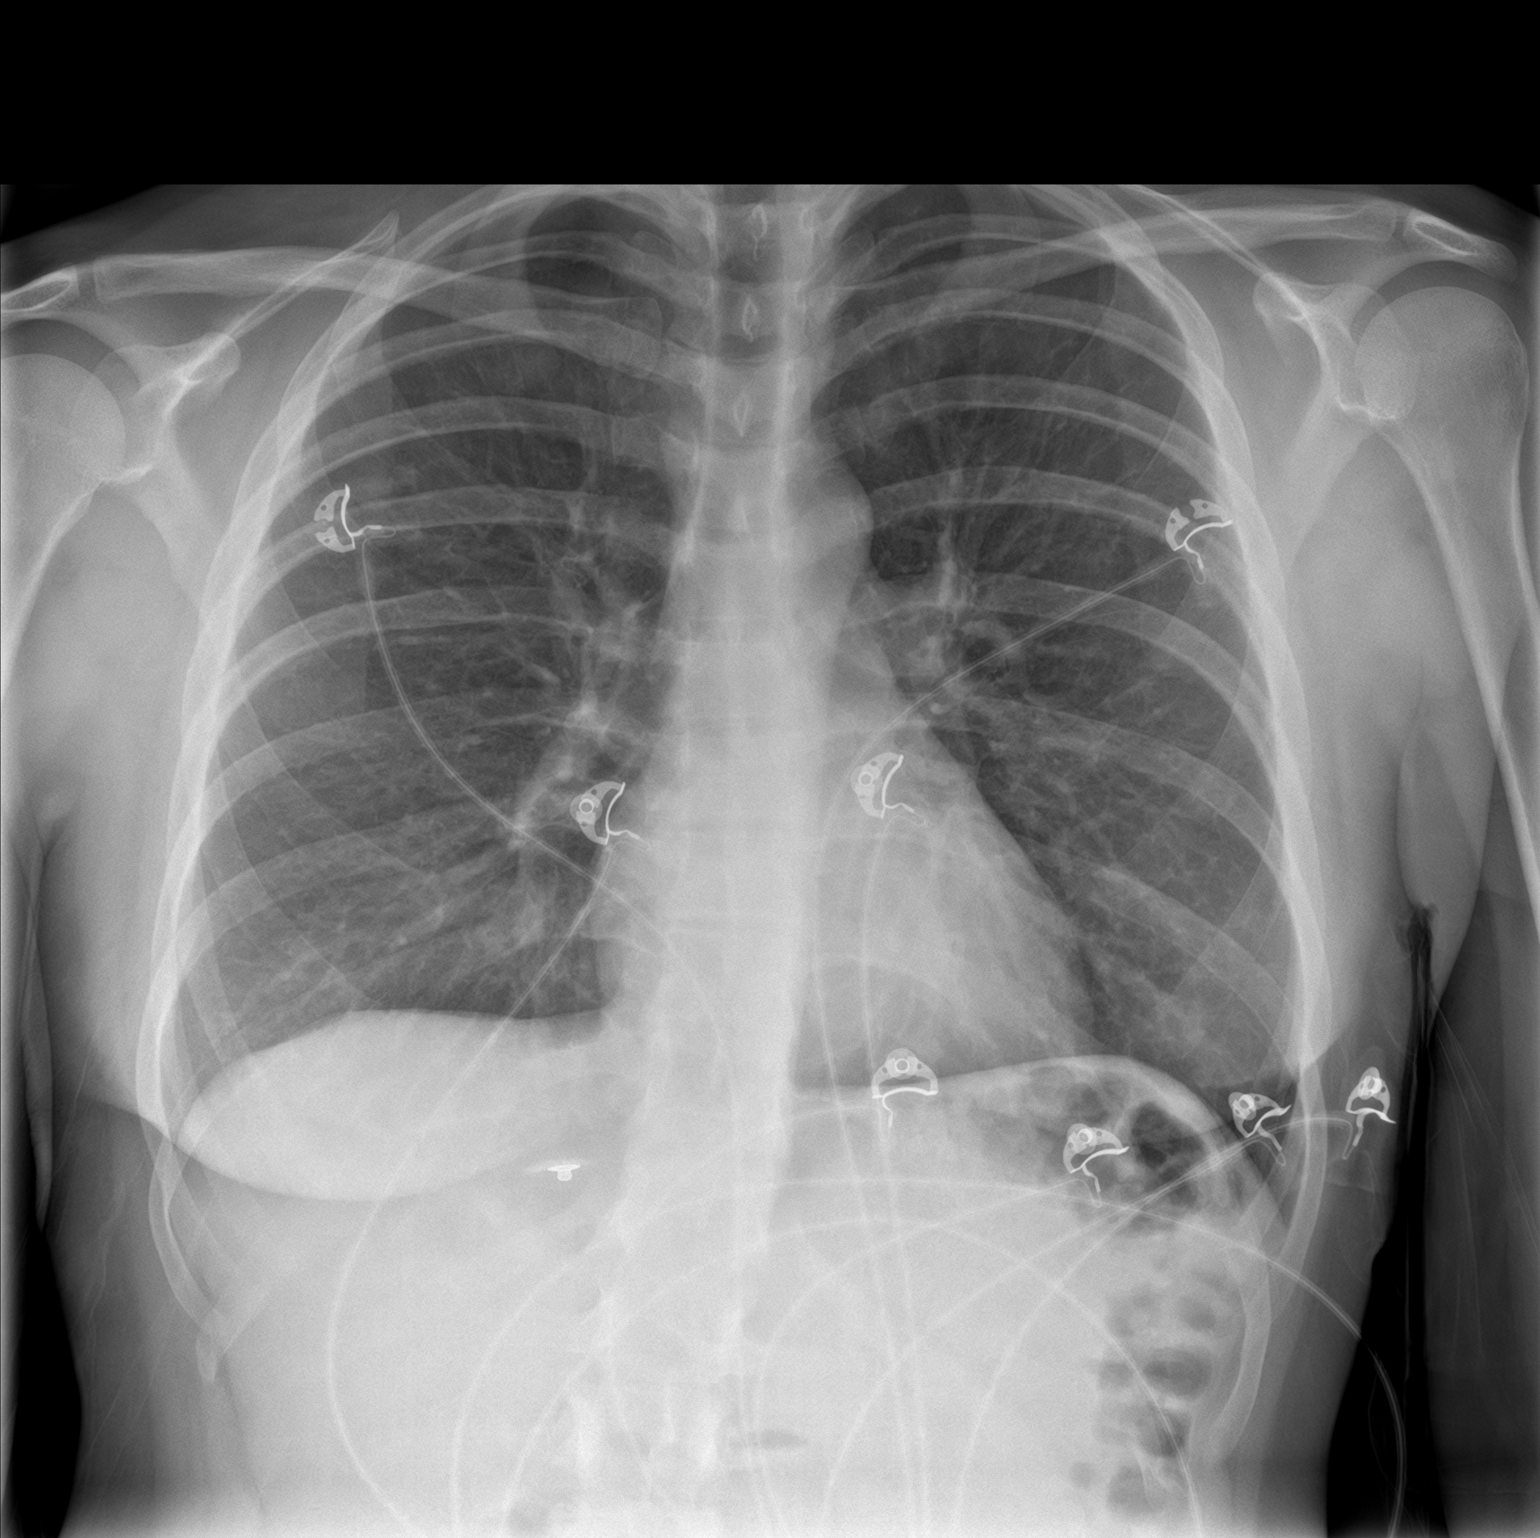

[chest lat]
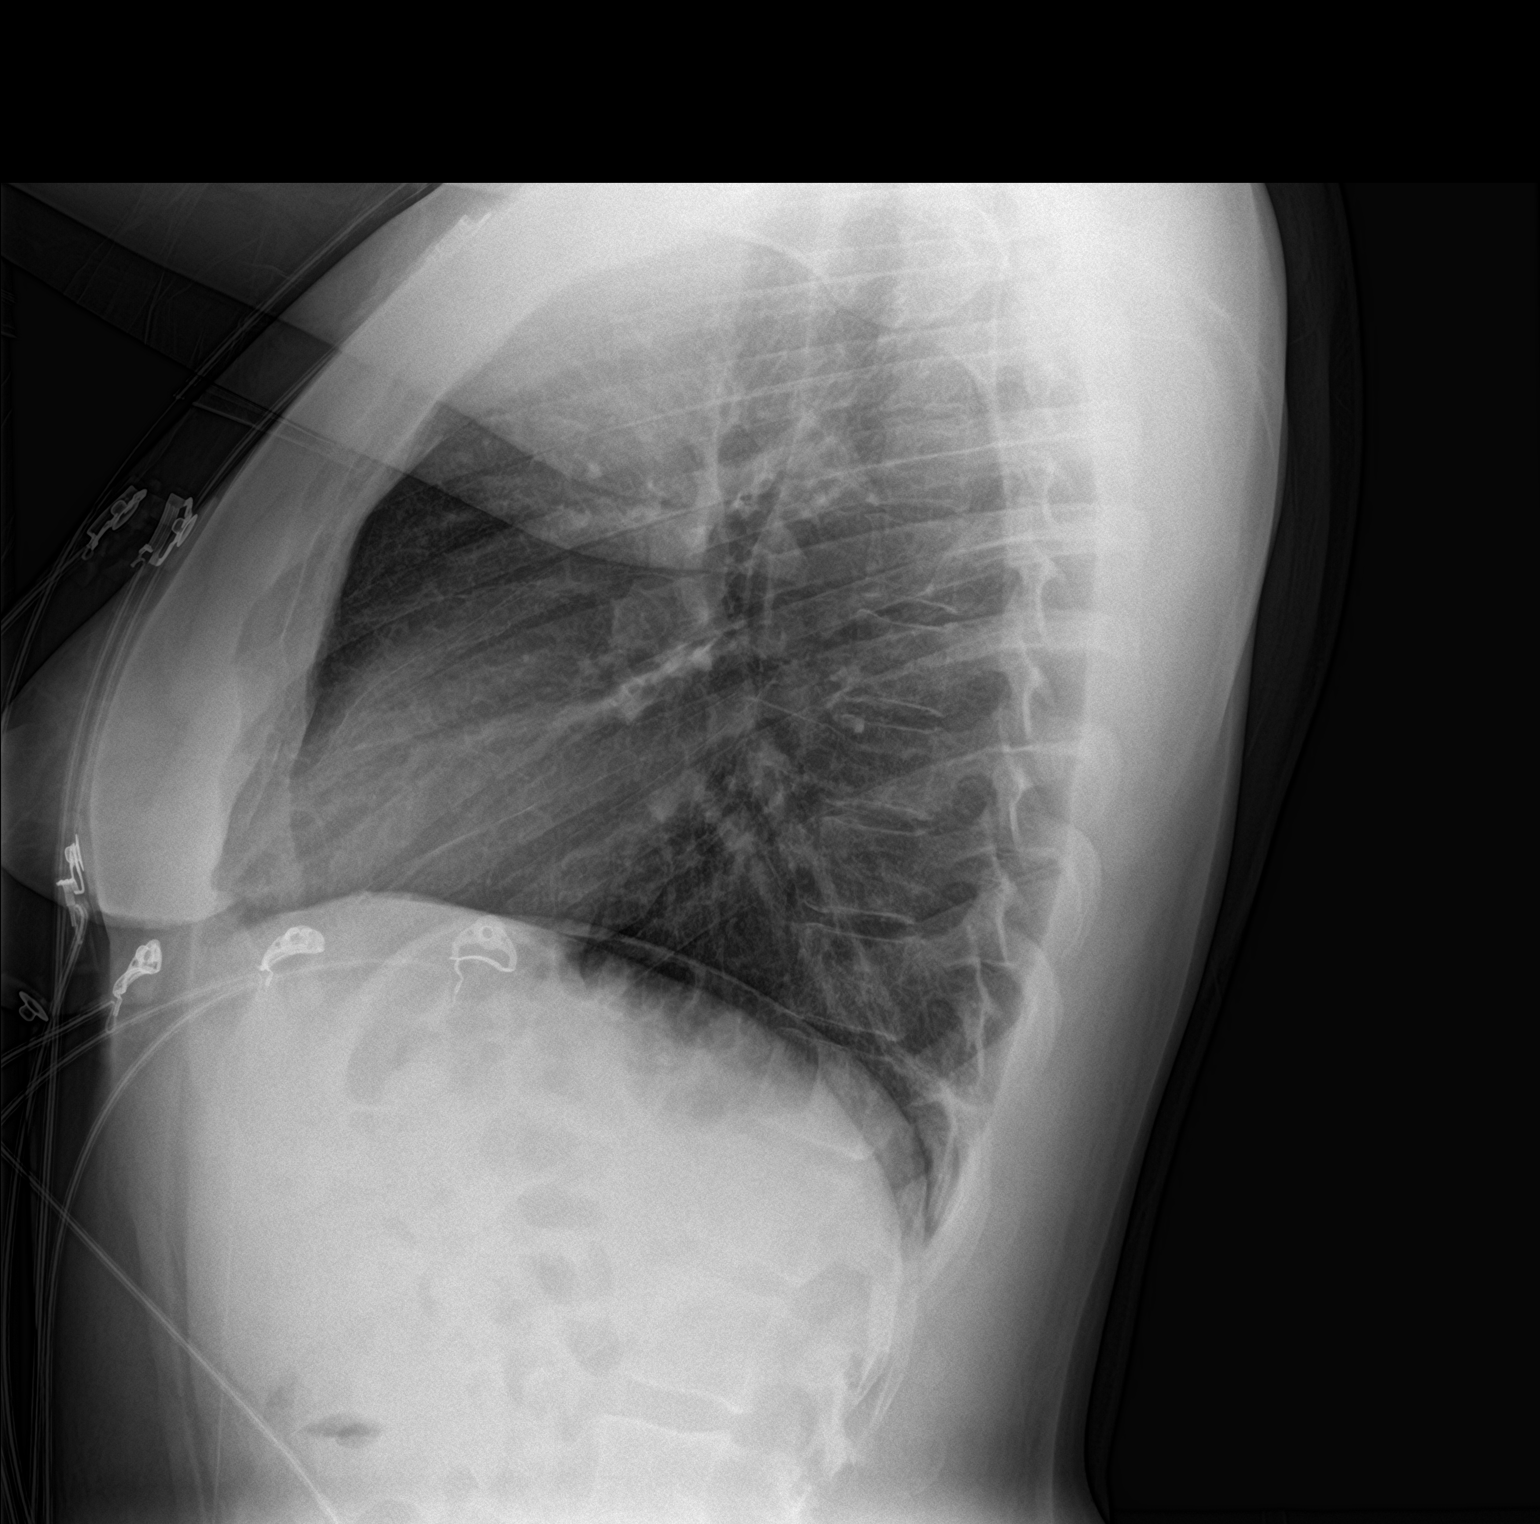

[2 of 2 positions shown; findings below may reference images not displayed]

FINDINGS: The cardiomediastinal contours are normal. The lungs are clear.
Pulmonary vasculature is normal. No consolidation, pleural effusion,
or pneumothorax. No acute osseous abnormalities are seen.
IMPRESSION: Unremarkable radiographs of the chest.

## 2019-01-17 NOTE — Discharge Instructions (Signed)
Your workup in the Emergency Department today was reassuring.  We did not find any specific abnormalities.  We recommend you drink plenty of fluids, take your regular medications and/or any new ones prescribed today, and follow up with the doctor(s) listed in these documents as recommended.  Return to the Emergency Department if you develop new or worsening symptoms that concern you.  
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
## 2019-01-17 NOTE — ED Triage Notes (Addendum)
Patient states that she has been having chest pain since her best friend died last week. Patient received ASA 324 and Nitro spray X 3 en route to the hospital. Patient states that she wore a mask to the funeral.

## 2019-11-14 IMAGING — CR DG FINGER RING 2+V*R*
3 series · 3 of 3 positions shown · non-contrast
Comparison: None.

CLINICAL DATA: Patient's right fourth digit got caught under handle
of inner tube. Now with pain around the D IP joint.

EXAM:
RIGHT RING FINGER 2+V

[finger ap]
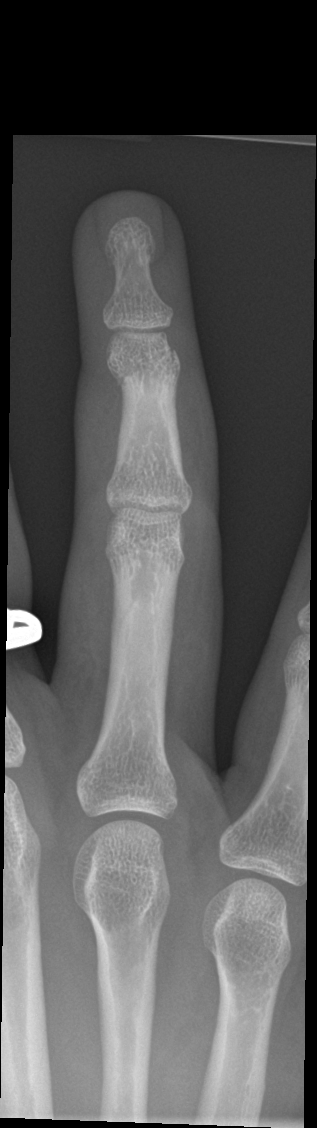

[finger obl]
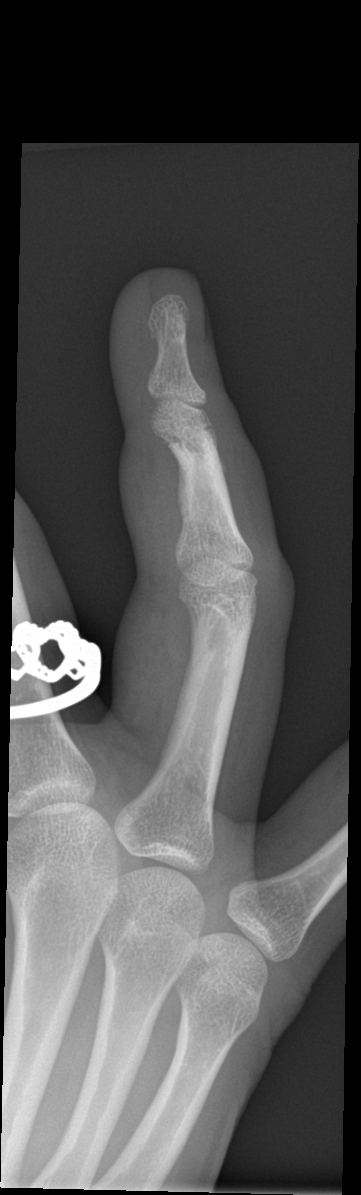

[finger lat]
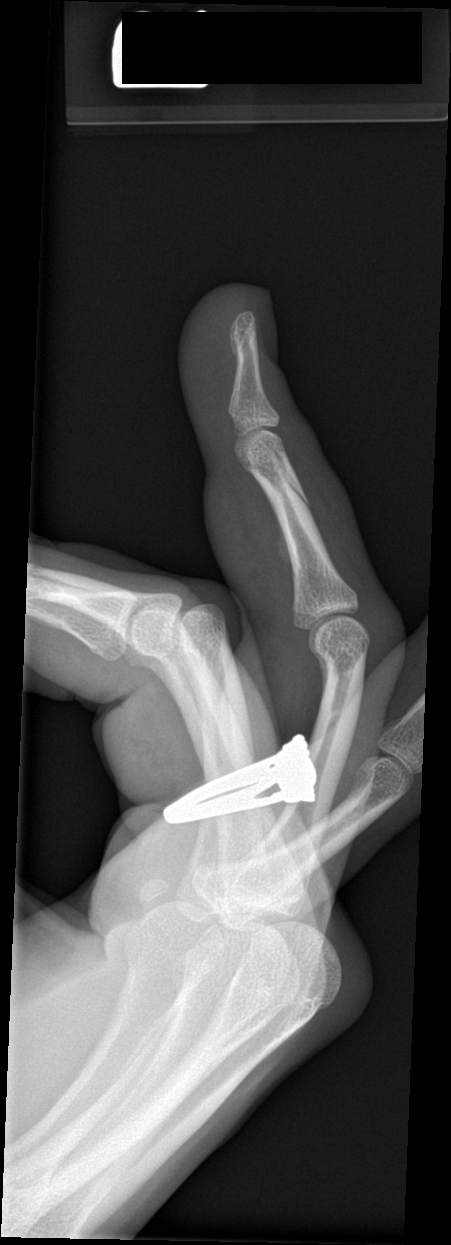

[3 of 3 positions shown; findings below may reference images not displayed]

FINDINGS: There is an acute fracture involving the distal aspect of the fourth
middle phalanx. Mild lateral displacement of the distal fracture
fragment identified. No significant angulation. No dislocation.
IMPRESSION: Acute fracture involves the distal aspect of the fourth middle
phalanx.

## 2019-11-22 ENCOUNTER — Other Ambulatory Visit: Payer: Self-pay

## 2019-11-22 ENCOUNTER — Encounter: Payer: Self-pay | Admitting: Family Medicine

## 2019-11-22 ENCOUNTER — Ambulatory Visit: Payer: Self-pay | Admitting: Family Medicine

## 2019-11-22 DIAGNOSIS — N760 Acute vaginitis: Secondary | ICD-10-CM

## 2019-11-22 DIAGNOSIS — Z113 Encounter for screening for infections with a predominantly sexual mode of transmission: Secondary | ICD-10-CM

## 2019-11-22 DIAGNOSIS — B9689 Other specified bacterial agents as the cause of diseases classified elsewhere: Secondary | ICD-10-CM

## 2019-11-22 LAB — WET PREP FOR TRICH, YEAST, CLUE
Trichomonas Exam: NEGATIVE
Yeast Exam: NEGATIVE

## 2019-11-22 MED ORDER — METRONIDAZOLE 500 MG PO TABS: 500.0000 mg | ORAL_TABLET | Freq: Two times a day (BID) | ORAL | 0 refills | Status: AC
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
## 2019-11-28 LAB — HEPATITIS B SURFACE ANTIGEN

## 2019-11-30 ENCOUNTER — Encounter: Payer: Self-pay | Admitting: Family Medicine

## 2019-11-30 NOTE — Progress Notes (Signed)
Attestation of Attending Supervision of Enhanced Role Nurse:  At public health departments, Enhanced Role Nurses work under standing orders and procedures to provide STI related screening services.. I agree with the care provided to this patient and was available for any consultation as needed.  I have reviewed the RN's note and chart.  I was not consulted by the RN for the patient. If consulted documentation of consult is reflected in the RN's documentation.  ° °Monte Bronder [place] Noriah Osgood, MD, MPH, ABFM °Medical Director  °[facility].  ° °

## 2019-12-05 ENCOUNTER — Encounter: Payer: Self-pay | Admitting: Family Medicine

## 2019-12-05 LAB — HM HIV SCREENING LAB: HM HIV Screening: NEGATIVE

## 2019-12-05 LAB — HM HEPATITIS C SCREENING LAB: HM Hepatitis Screen: NEGATIVE

## 2020-04-04 NOTE — Telephone Encounter (Signed)
Telephone call regarding Hepatitis C results. Glenna Fellows, RN

## 2020-07-24 ENCOUNTER — Emergency Department
Admission: EM | Admit: 2020-07-24 | Discharge: 2020-07-24 | Disposition: A | Discharge status: Home / Self Care | Payer: Medicaid Other | Attending: Emergency Medicine | Admitting: Emergency Medicine

## 2020-07-24 ENCOUNTER — Other Ambulatory Visit: Payer: Self-pay

## 2020-07-24 ENCOUNTER — Emergency Department: Payer: Medicaid Other

## 2020-07-24 DIAGNOSIS — F17210 Nicotine dependence, cigarettes, uncomplicated: Secondary | ICD-10-CM | POA: Diagnosis not present

## 2020-07-24 DIAGNOSIS — K625 Hemorrhage of anus and rectum: Secondary | ICD-10-CM | POA: Insufficient documentation

## 2020-07-24 DIAGNOSIS — R002 Palpitations: Secondary | ICD-10-CM

## 2020-07-24 LAB — BASIC METABOLIC PANEL
Anion gap: 7 (ref 5–15)
BUN: 11 mg/dL (ref 6–20)
CO2: 23 mmol/L (ref 22–32)
Calcium: 8.9 mg/dL (ref 8.9–10.3)
Chloride: 109 mmol/L (ref 98–111)
Creatinine, Ser: 0.82 mg/dL (ref 0.44–1.00)
GFR, Estimated: >60 mL/min (ref >60)
Glucose, Bld: 115 mg/dL — ABNORMAL HIGH (ref 70–99)
Potassium: 3.9 mmol/L (ref 3.5–5.1)
Sodium: 139 mmol/L (ref 135–145)

## 2020-07-24 LAB — CBC
HCT: 43.3 % (ref 36.0–46.0)
Hemoglobin: 14.4 g/dL (ref 12.0–15.0)
MCH: 29.4 pg (ref 26.0–34.0)
MCHC: 33.3 g/dL (ref 30.0–36.0)
MCV: 88.4 fL (ref 80.0–100.0)
Platelets: 297 10*3/uL (ref 150–400)
RBC: 4.9 MIL/uL (ref 3.87–5.11)
RDW: 13.9 % (ref 11.5–15.5)
WBC: 12.8 10*3/uL — ABNORMAL HIGH (ref 4.0–10.5)
nRBC: 0 % (ref 0.0–0.2)

## 2020-07-24 LAB — TROPONIN I (HIGH SENSITIVITY): Troponin I (High Sensitivity): 3 ng/L (ref <18)

## 2020-07-24 IMAGING — CR DG CHEST 2V
1 series · 2 of 2 positions shown · non-contrast
Comparison: [DATE]

CLINICAL DATA: Intermittent chest pain and palpitations x1 day

EXAM:
CHEST - 2 VIEW

[Series 1: dg chest 2 view · 0.14mm/px · 2 of 2 slices shown]
[im 1/2]
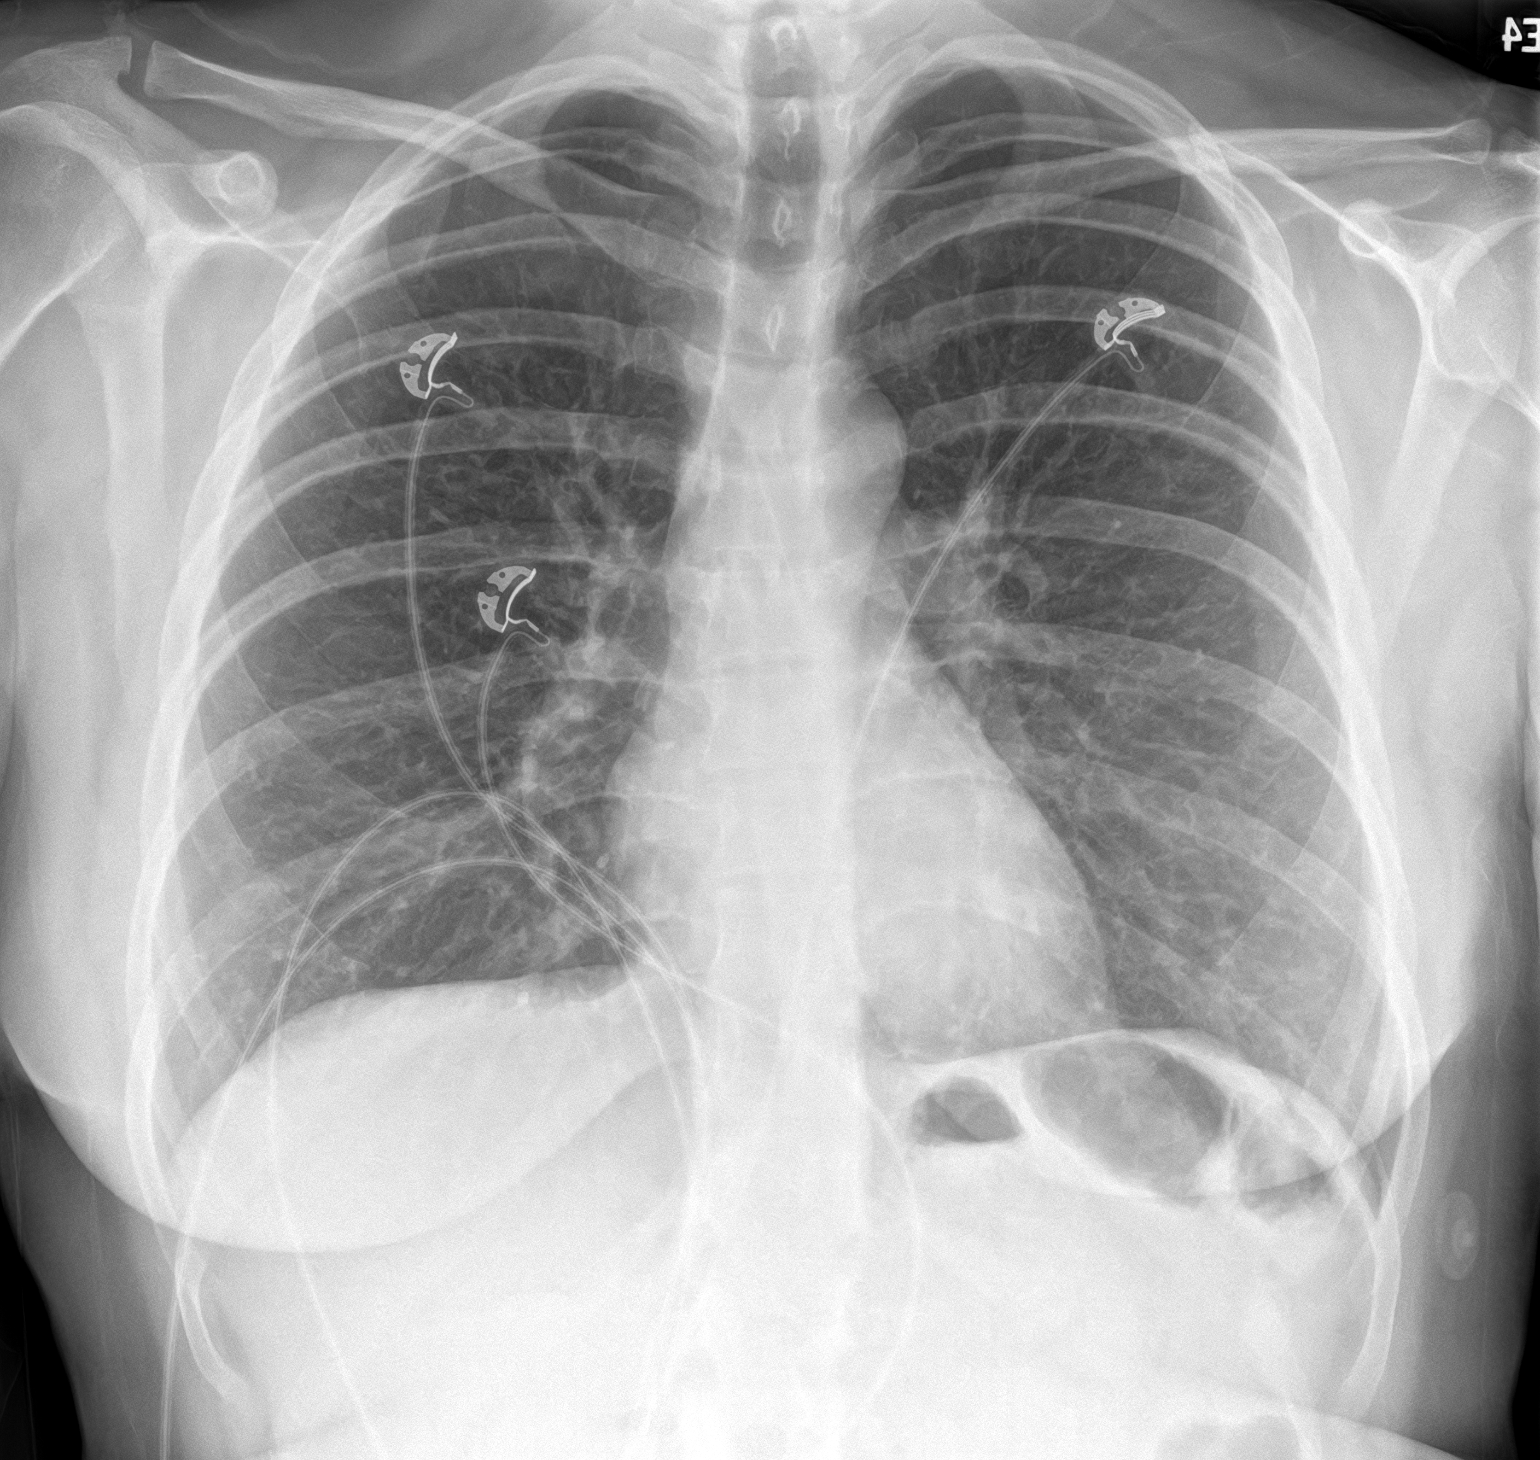
[im 2/2]
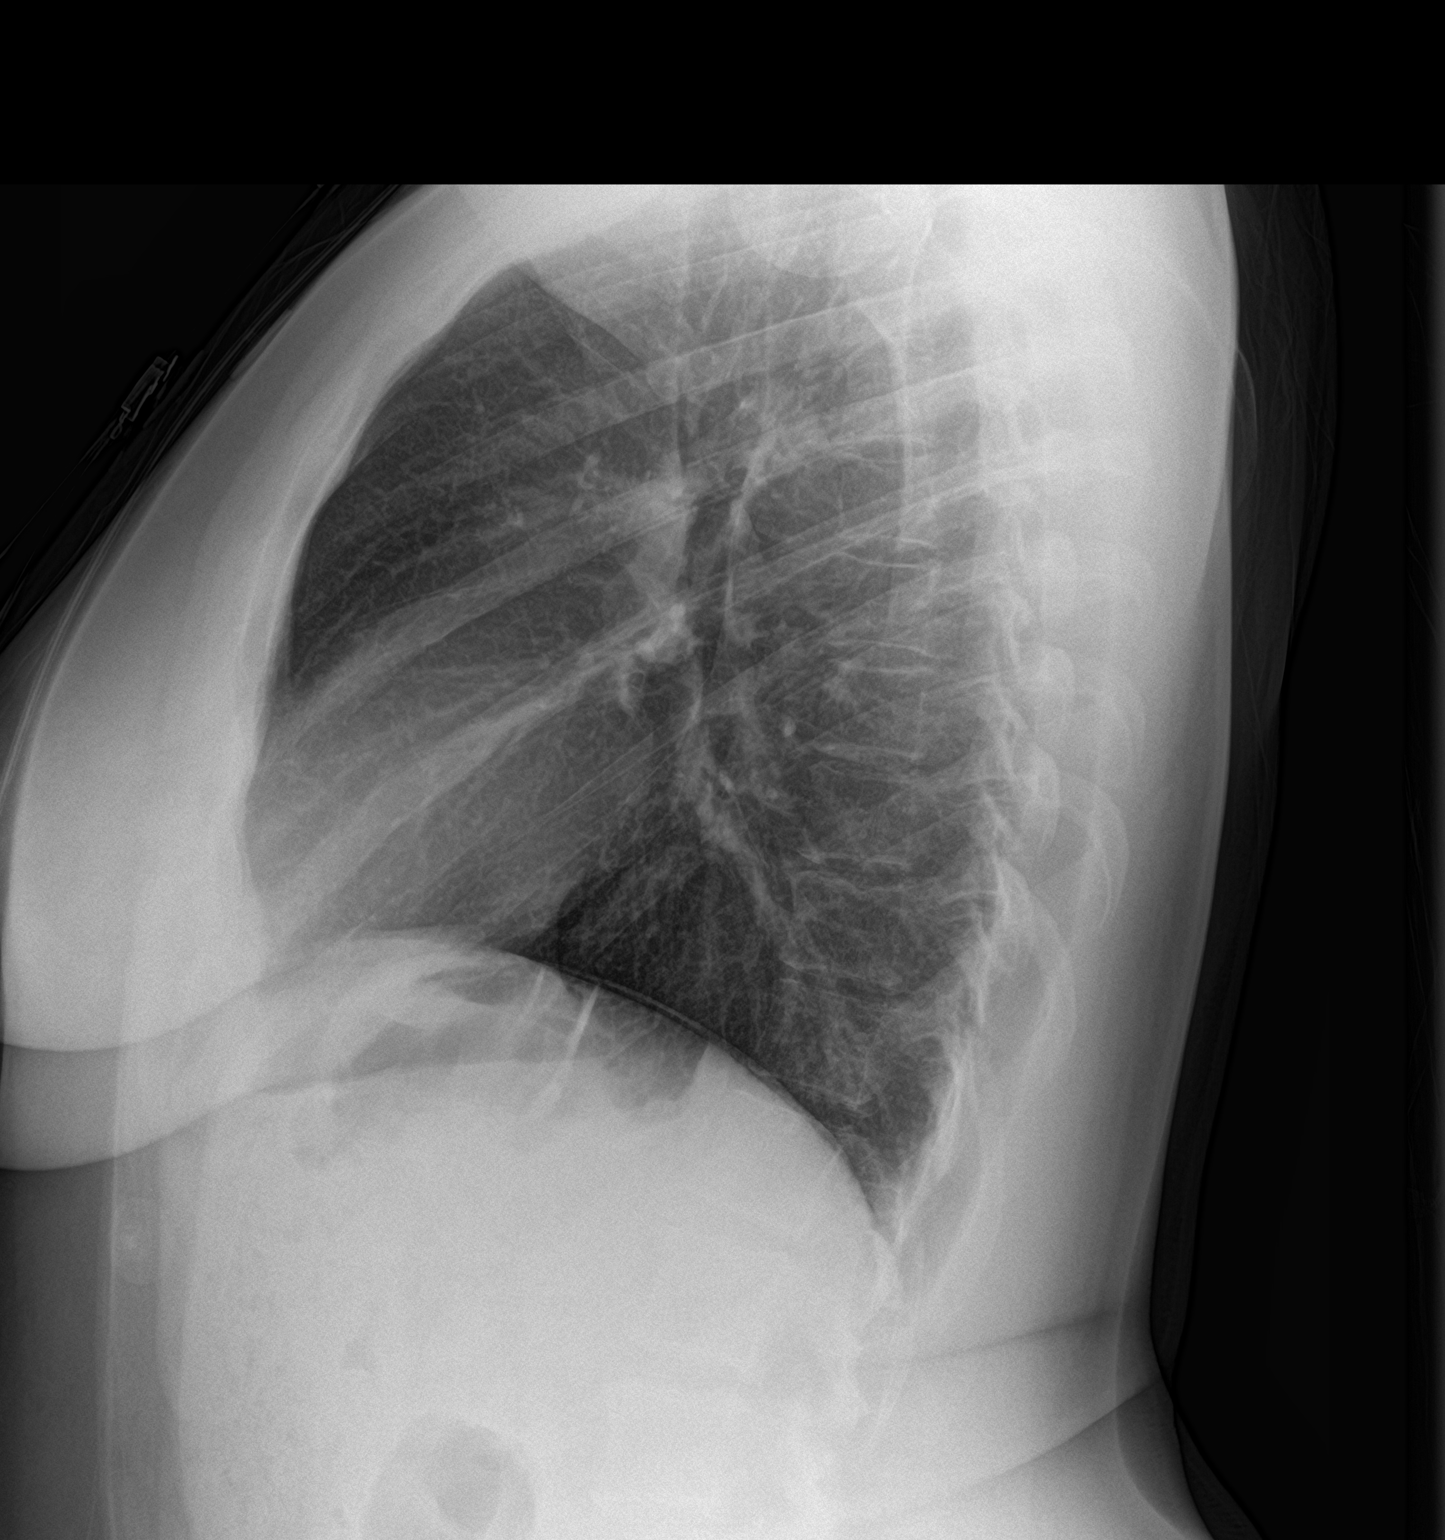

[2 of 2 positions shown; findings below may reference images not displayed]

FINDINGS: The heart size and mediastinal contours are within normal limits. No
focal consolidation. No pleural effusion. No pneumothorax. The
visualized skeletal structures are unremarkable.
IMPRESSION: No active cardiopulmonary disease.

## 2020-07-24 NOTE — ED Notes (Signed)
Rectal exam assist by Anne Fu, RN.
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
## 2020-07-24 NOTE — ED Notes (Signed)
Patient made aware of need for urine sample.

## 2020-07-24 NOTE — ED Notes (Signed)
Patient is resting comfortably. 

## 2020-07-24 NOTE — ED Triage Notes (Signed)
Pt c/o intermittent chest pain/palpations for the past month, states she notices it more when she is laying down at night, pt is also c/o bright red blood in her stool today.

## 2020-07-24 NOTE — ED Notes (Signed)
Patient transported to X-ray 

## 2020-07-24 NOTE — ED Notes (Signed)
See triage note. Patient reports chest pain, and heart racing x several weeks. Patient also reports bloody stool x 1 episode today. Patient denies other GI symptoms.

## 2020-07-30 ENCOUNTER — Emergency Department
Admission: EM | Admit: 2020-07-30 | Discharge: 2020-07-30 | Disposition: A | Discharge status: Home / Self Care | Payer: Medicaid Other | Attending: Emergency Medicine | Admitting: Emergency Medicine

## 2020-07-30 ENCOUNTER — Other Ambulatory Visit: Payer: Self-pay

## 2020-07-30 ENCOUNTER — Encounter: Payer: Self-pay | Admitting: Emergency Medicine

## 2020-07-30 ENCOUNTER — Emergency Department: Payer: Medicaid Other

## 2020-07-30 DIAGNOSIS — X58XXXA Exposure to other specified factors, initial encounter: Secondary | ICD-10-CM | POA: Insufficient documentation

## 2020-07-30 DIAGNOSIS — S99922A Unspecified injury of left foot, initial encounter: Secondary | ICD-10-CM | POA: Diagnosis present

## 2020-07-30 DIAGNOSIS — F17210 Nicotine dependence, cigarettes, uncomplicated: Secondary | ICD-10-CM | POA: Insufficient documentation

## 2020-07-30 DIAGNOSIS — S92212A Displaced fracture of cuboid bone of left foot, initial encounter for closed fracture: Secondary | ICD-10-CM | POA: Diagnosis not present

## 2020-07-30 IMAGING — DX DG FOOT COMPLETE 3+V*L*
3 series · 3 of 3 positions shown · non-contrast
Comparison: No prior.

CLINICAL DATA: Increasing left foot pain.  Recent cuboid fracture.

EXAM:
LEFT FOOT - COMPLETE 3+ VIEW

[foot ap]
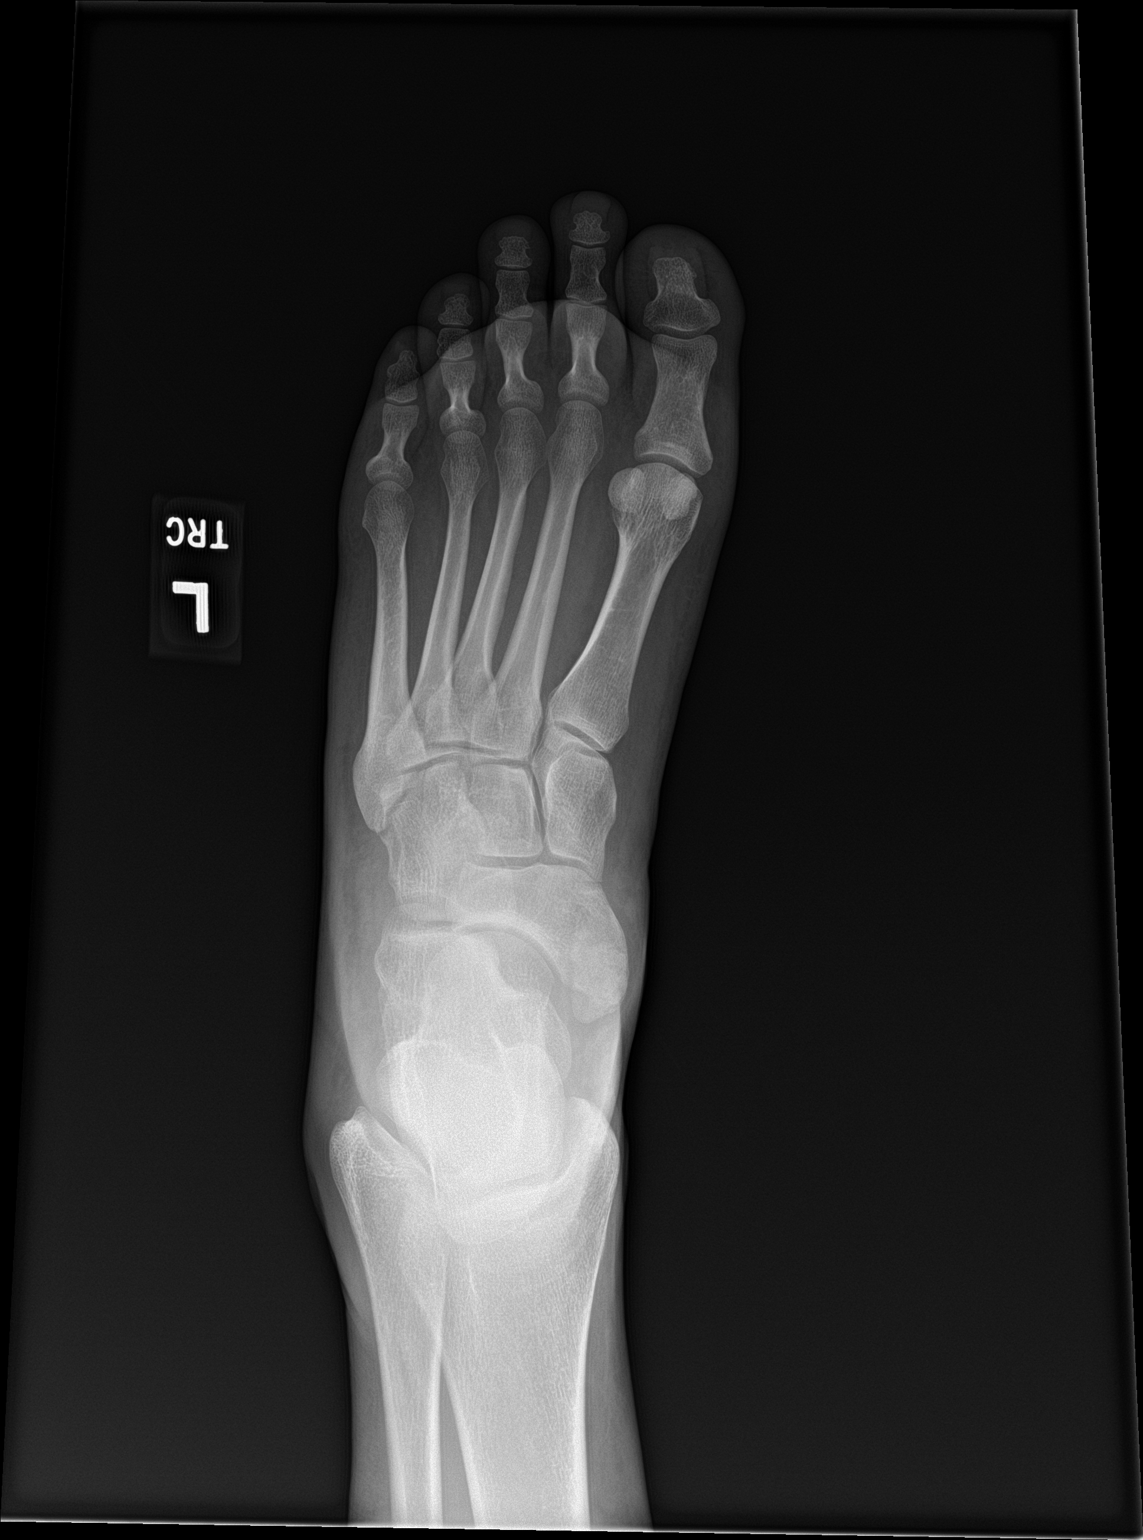

[foot obl]
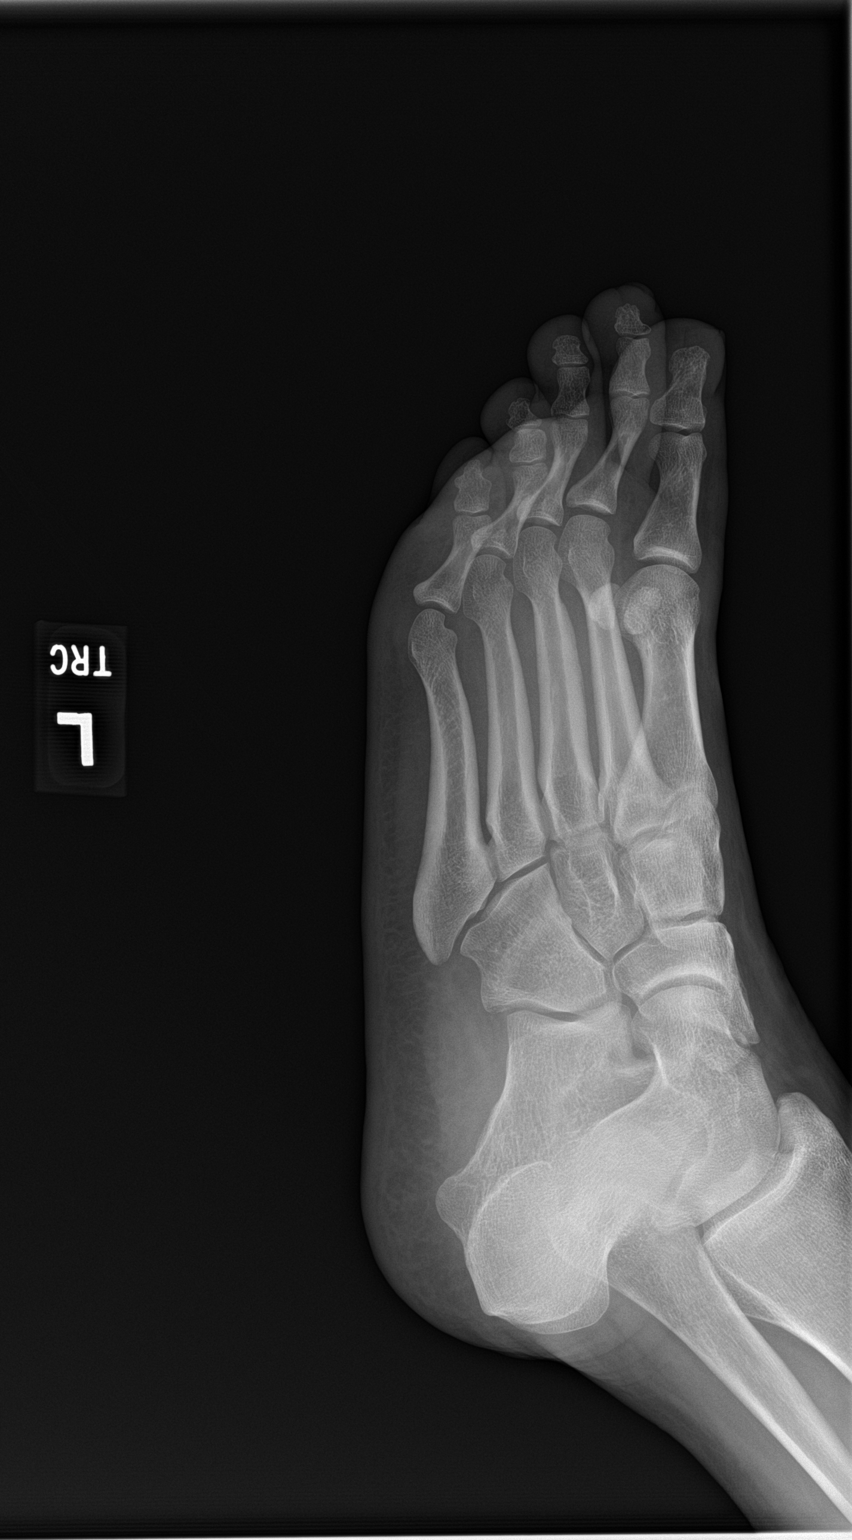

[foot lat]
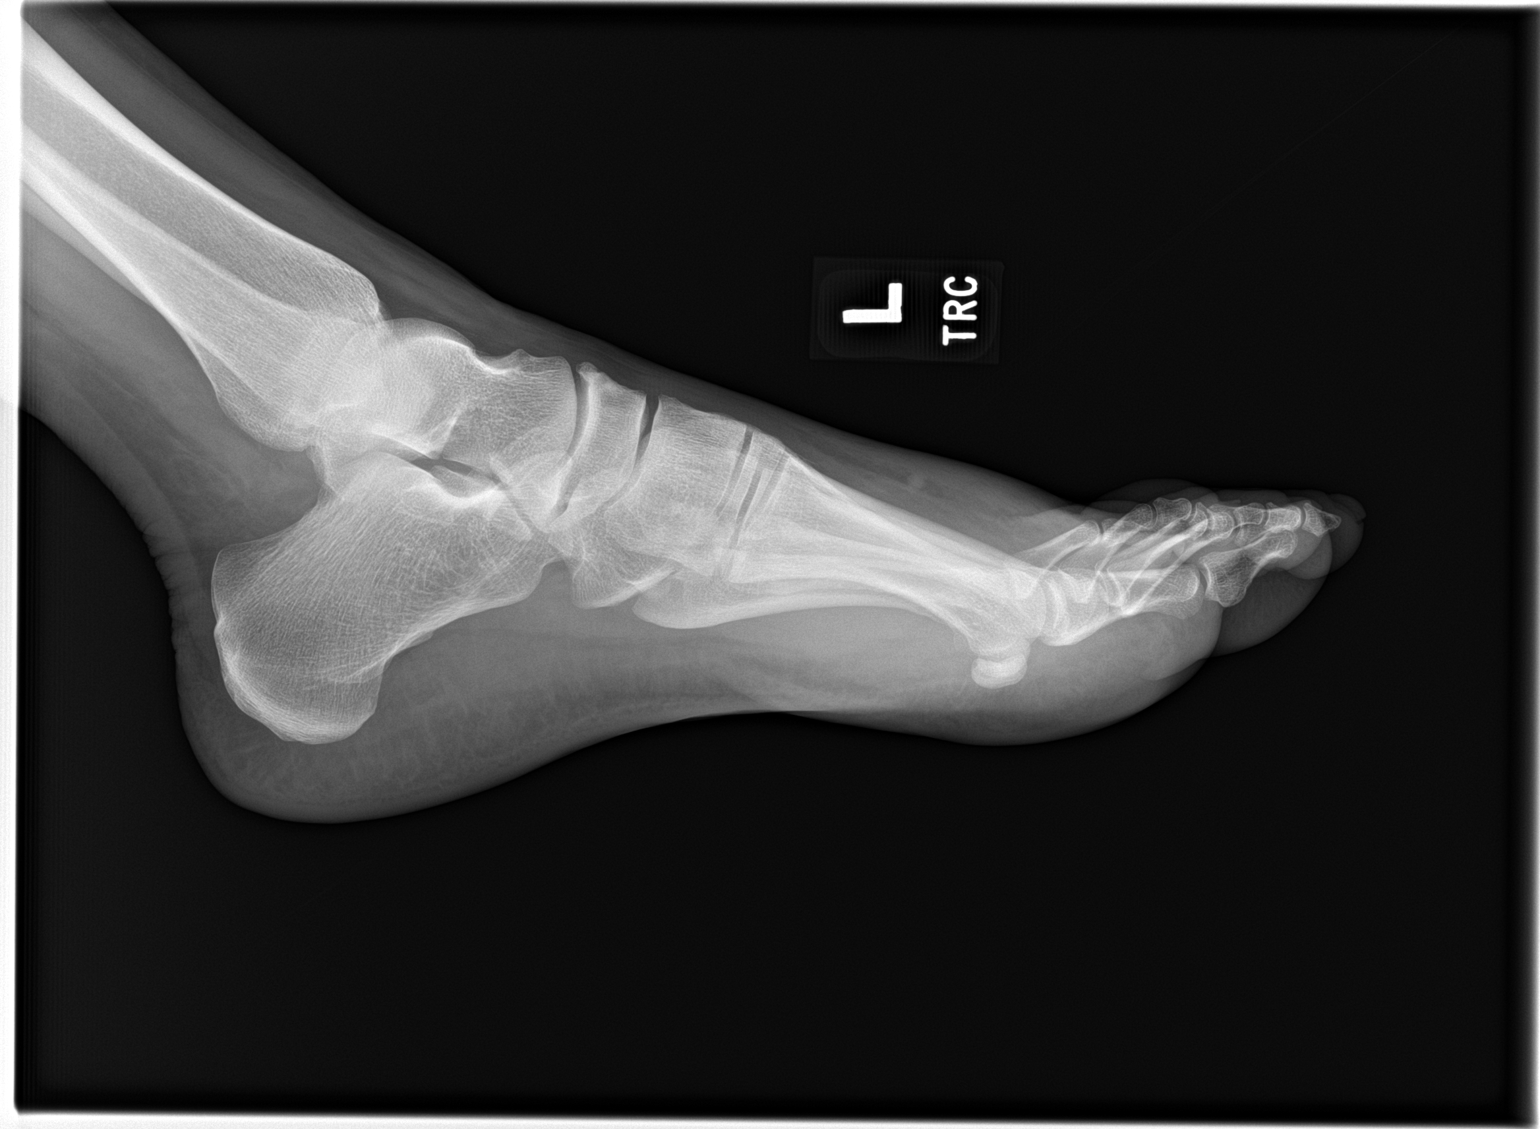

[3 of 3 positions shown; findings below may reference images not displayed]

FINDINGS: Soft tissues are unremarkable. No radiopaque foreign body. Very
subtle lucency noted in the cuboid. Subtle nondisplaced fracture
cannot be excluded. No other focal bony abnormality identified.
IMPRESSION: Very subtle lucency noted the cuboid. Subtle nondisplaced fracture
cannot be excluded. No other abnormality identified.

## 2020-07-30 MED ORDER — ONDANSETRON 4 MG PO TBDP
4.0000 mg | ORAL_TABLET | Freq: Once | ORAL | Status: AC
  Administered 2020-07-30: 4 mg via ORAL
  Filled 2020-07-30: qty 1

## 2020-07-30 MED ORDER — OXYCODONE-ACETAMINOPHEN 5-325 MG PO TABS: 1.0000 | ORAL_TABLET | ORAL | 0 refills | Status: DC | PRN

## 2020-07-30 MED ORDER — ONDANSETRON 4 MG PO TBDP: 4.0000 mg | ORAL_TABLET | Freq: Three times a day (TID) | ORAL | 0 refills | Status: DC | PRN

## 2020-07-30 MED ORDER — OXYCODONE-ACETAMINOPHEN 5-325 MG PO TABS
1.0000 | ORAL_TABLET | Freq: Once | ORAL | Status: AC
  Administered 2020-07-30: 1 via ORAL
  Filled 2020-07-30: qty 1

## 2020-07-30 NOTE — Discharge Instructions (Signed)
Follow-up with orthopedics as scheduled for your left cuboid fracture.  Please keep your splint on at all times and keep it clean and dry.  You need to continue to stay with nonweightbearing at this time until cleared by orthopedics.   You are being provided a prescription for opiates (also known as narcotics) for pain control.  Opiates can be addictive and should only be used when absolutely necessary for pain control when other alternatives do not work.  We recommend you only use them for the recommended amount of time and only as prescribed.  Please do not take with other sedative medications or alcohol.  Please do not drive, operate machinery, make important decisions while taking opiates.  Please note that these medications can be addictive and have high abuse potential.  Patients can become addicted to narcotics after only taking them for a few days.  Please keep these medications locked away from children, teenagers or any family members with history of substance abuse.  Narcotic pain medicine may also make you constipated.  You may use over-the-counter medications such as MiraLAX, Colace to prevent constipation.  If you become constipated you may use over-the-counter enemas as needed.  Itching and nausea are common side effects of narcotic pain medication.  If you develop uncontrolled vomiting or a rash, please stop these medications.

## 2020-07-30 NOTE — ED Notes (Signed)
Pt taken to lobby in [facility] she is waiting for her ride. VSS. NAD. All questions and concerns addressed.
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
## 2020-07-30 NOTE — ED Triage Notes (Addendum)
Pt to triage via w/c with no distress noted; pt seen 5/2 recently for fx left foot, splint in place and has f/u with ortho on 5/13; seen yesterday for c/o pain to heel and had add'l padding placed to area; returns today for persistent heel pain

## 2020-08-03 ENCOUNTER — Emergency Department
Admission: EM | Admit: 2020-08-03 | Discharge: 2020-08-03 | Disposition: A | Discharge status: Home / Self Care | Payer: Medicaid Other | Attending: Emergency Medicine | Admitting: Emergency Medicine

## 2020-08-03 ENCOUNTER — Emergency Department: Payer: Medicaid Other

## 2020-08-03 ENCOUNTER — Other Ambulatory Visit: Payer: Self-pay

## 2020-08-03 DIAGNOSIS — M7662 Achilles tendinitis, left leg: Secondary | ICD-10-CM | POA: Diagnosis not present

## 2020-08-03 DIAGNOSIS — S92215D Nondisplaced fracture of cuboid bone of left foot, subsequent encounter for fracture with routine healing: Secondary | ICD-10-CM | POA: Diagnosis not present

## 2020-08-03 DIAGNOSIS — X58XXXD Exposure to other specified factors, subsequent encounter: Secondary | ICD-10-CM | POA: Diagnosis not present

## 2020-08-03 DIAGNOSIS — F17210 Nicotine dependence, cigarettes, uncomplicated: Secondary | ICD-10-CM | POA: Diagnosis not present

## 2020-08-03 DIAGNOSIS — S99922D Unspecified injury of left foot, subsequent encounter: Secondary | ICD-10-CM | POA: Diagnosis present

## 2020-08-03 IMAGING — DX DG FOOT COMPLETE 3+V*L*
3 series · 3 of 3 positions shown · non-contrast
Comparison: [DATE]

CLINICAL DATA: Left foot and heel pain. Recently diagnosed cuboid
fracture.

EXAM:
LEFT FOOT - COMPLETE 3+ VIEW

[foot ap]
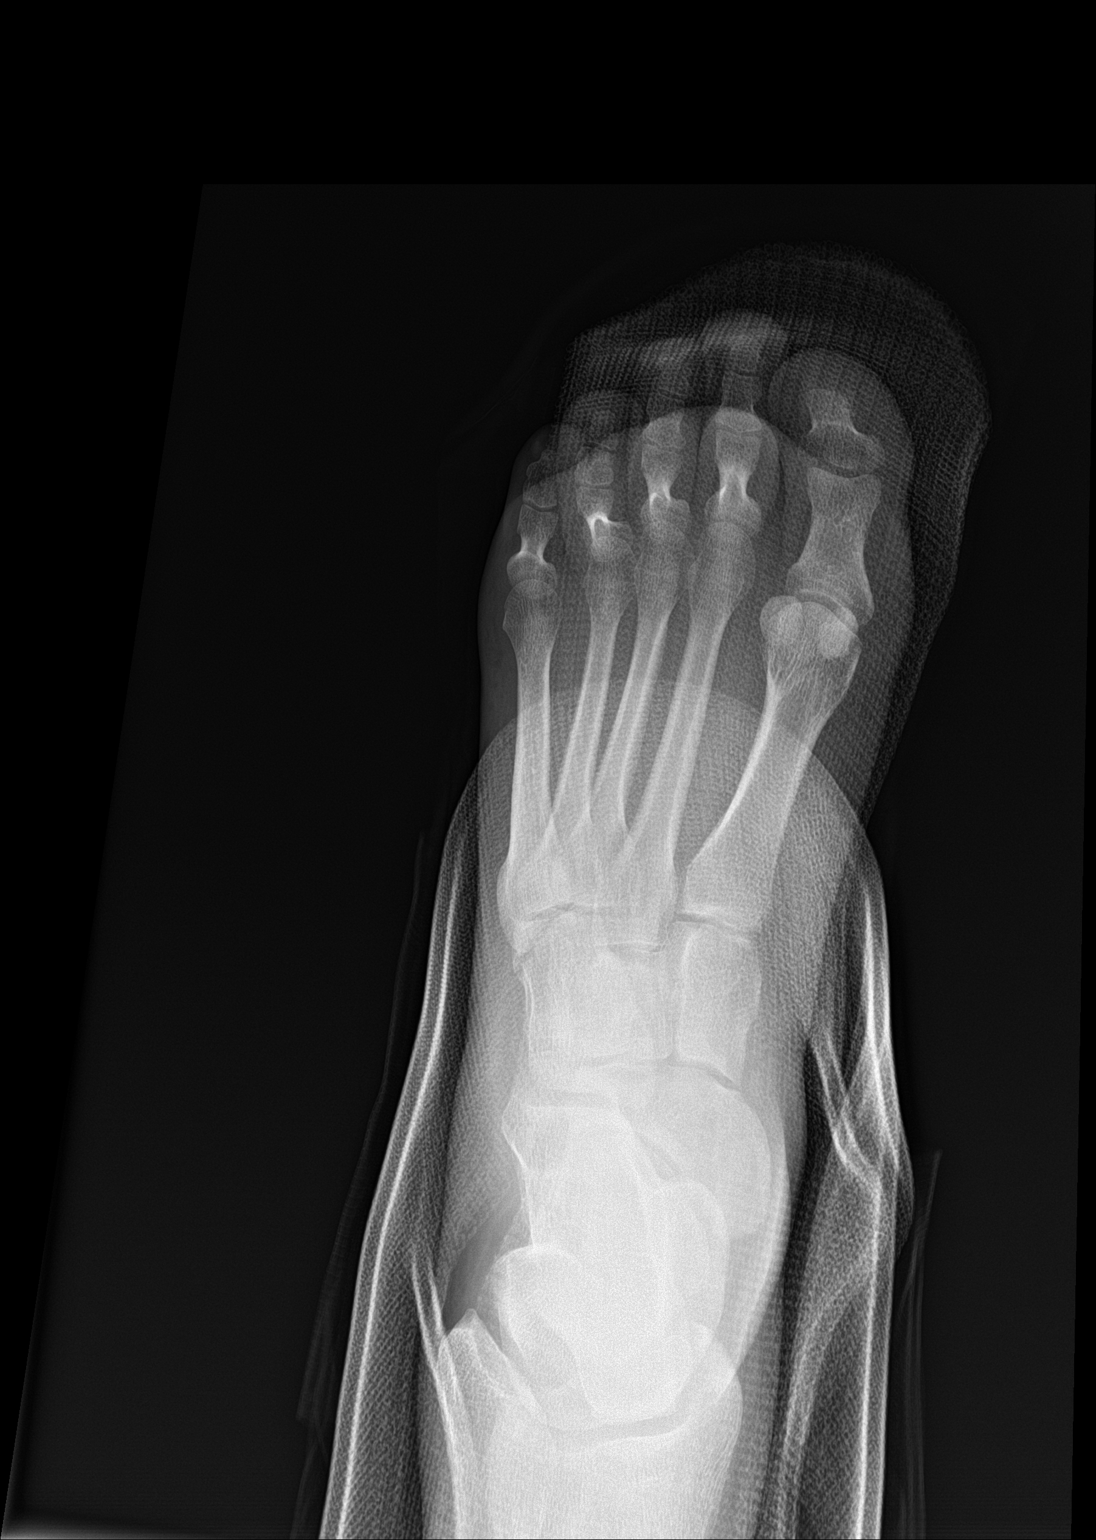

[foot obl]
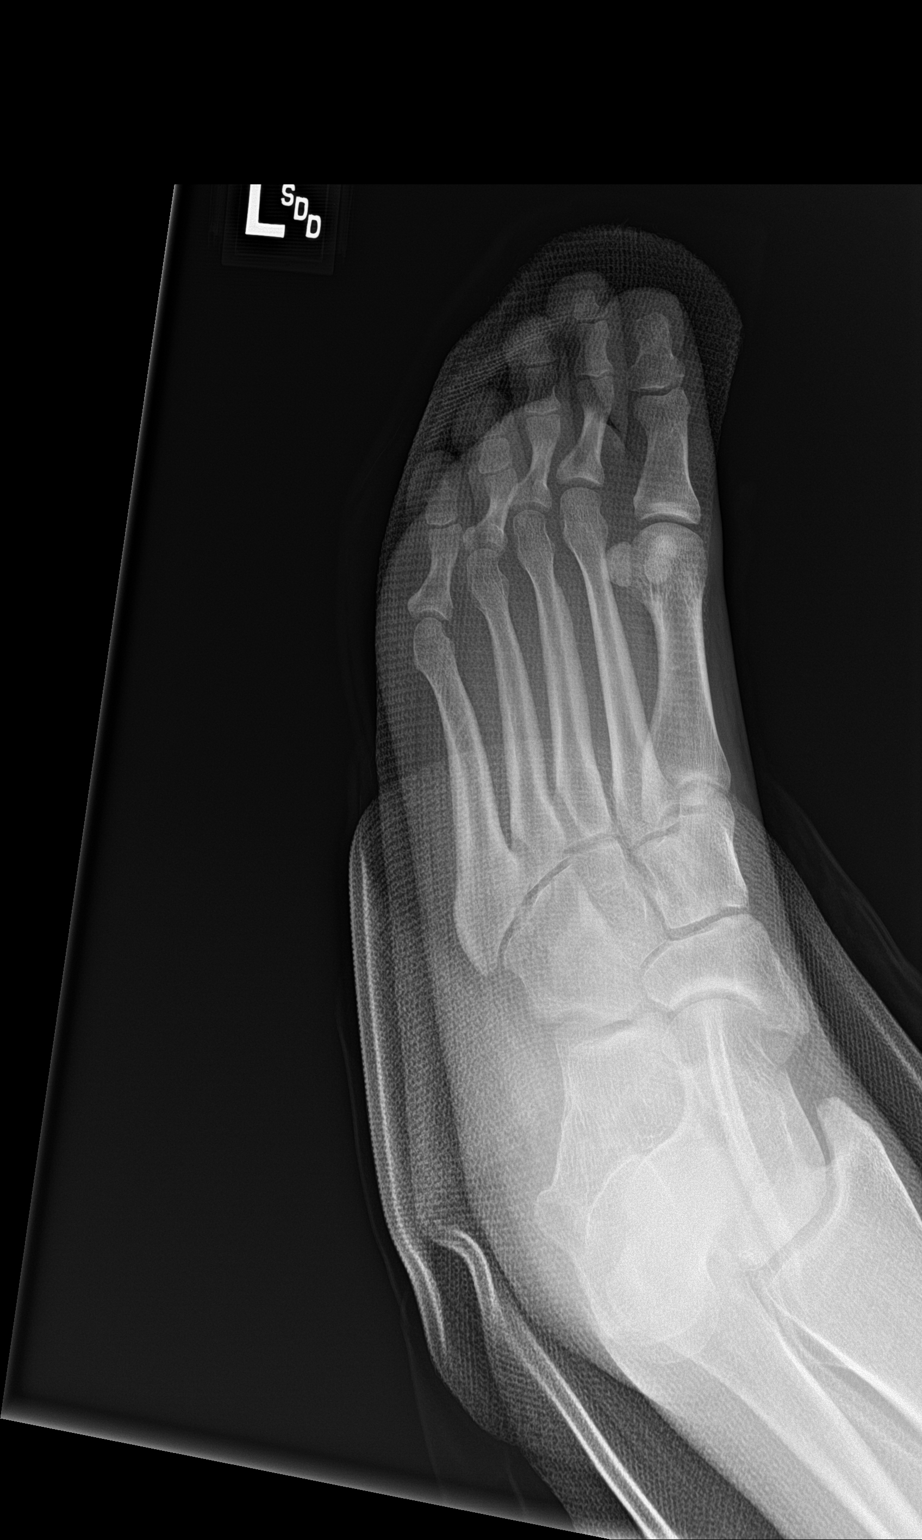

[foot lat]
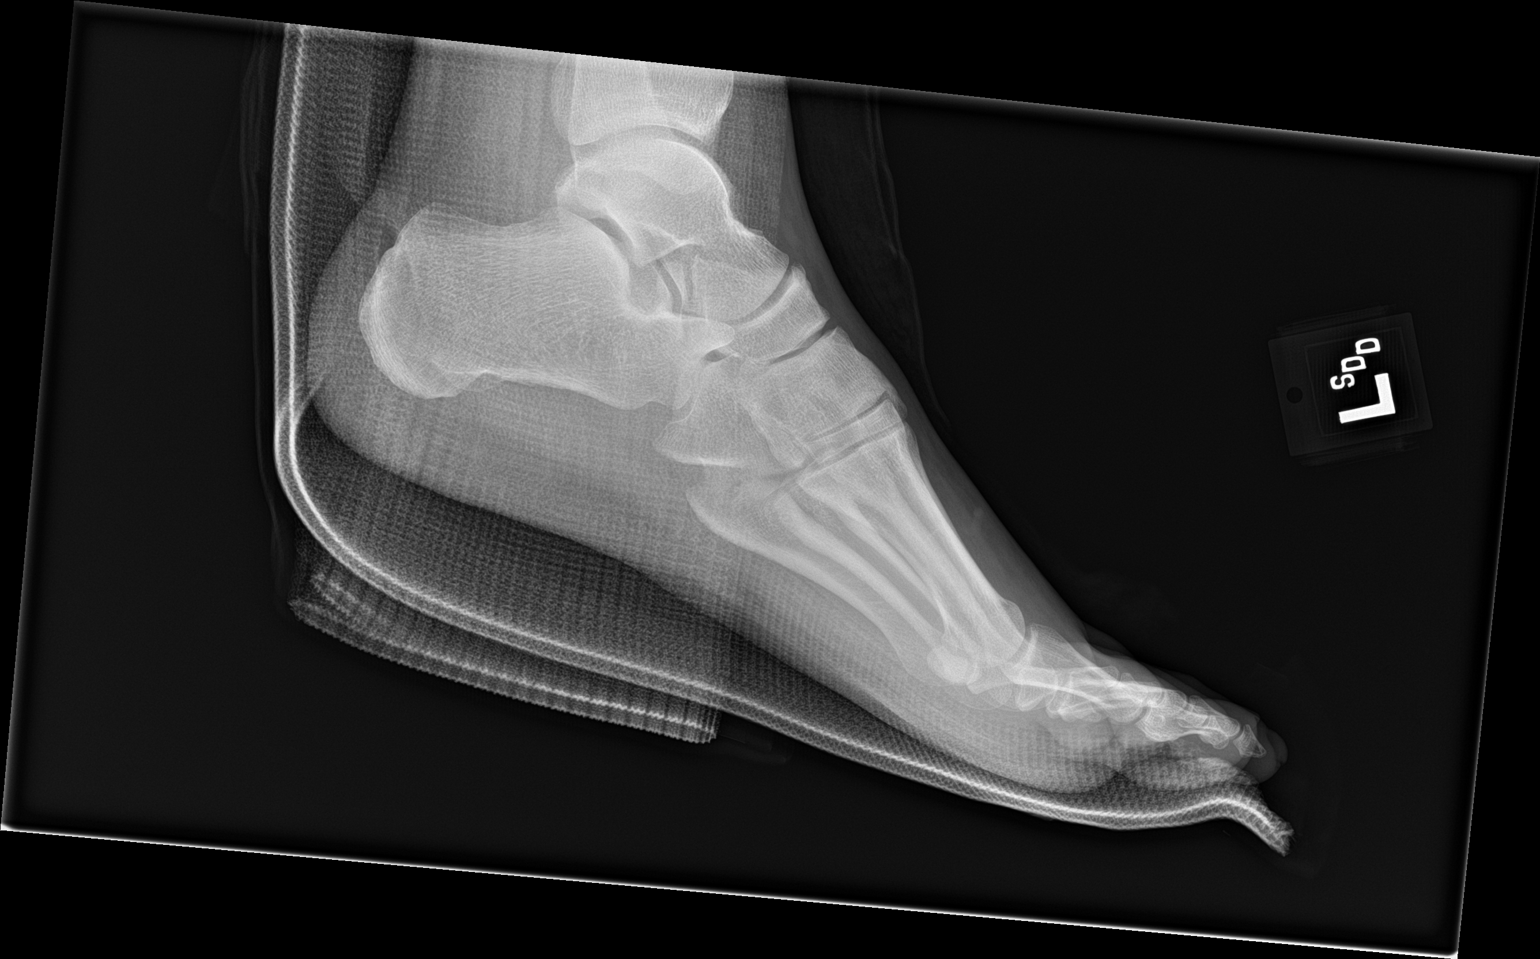

[3 of 3 positions shown; findings below may reference images not displayed]

FINDINGS: Interval fiberglass splint. This is obscuring the bony T tail with
no gross change in a nondisplaced cuboid fracture extending into the
cuboid/4th metatarsal joint. No additional fractures are seen. No
dislocations.
IMPRESSION: Grossly stable nondisplaced cuboid fracture.

## 2020-08-03 MED ORDER — MELOXICAM 15 MG PO TABS: 15.0000 mg | ORAL_TABLET | Freq: Every day | ORAL | 0 refills | Status: DC

## 2020-08-03 NOTE — ED Triage Notes (Signed)
Pt states broke L foot, got a splint put on at [facility] on monday. States splint is hurting pt on her heel. States splint is digging into heel. A&O, in wheelchair.
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
## 2020-08-03 NOTE — ED Notes (Signed)
Pt c/o throbbing left heel pain, states fracture does not hurt. Left foot in splint, pt states she has orthopedic appointment on Friday. CMS intact, capillary refill <3 seconds.

## 2020-08-15 ENCOUNTER — Ambulatory Visit (INDEPENDENT_AMBULATORY_CARE_PROVIDER_SITE_OTHER): Payer: Medicaid Other

## 2020-08-15 ENCOUNTER — Encounter: Payer: Self-pay | Admitting: Cardiology

## 2020-08-15 ENCOUNTER — Other Ambulatory Visit: Payer: Self-pay

## 2020-08-15 ENCOUNTER — Ambulatory Visit (INDEPENDENT_AMBULATORY_CARE_PROVIDER_SITE_OTHER): Payer: Self-pay | Admitting: Cardiology

## 2020-08-15 VITALS — BP 106/72 | HR 85 | Ht 66.0 in | Wt 189.0 lb

## 2020-08-15 DIAGNOSIS — F17200 Nicotine dependence, unspecified, uncomplicated: Secondary | ICD-10-CM

## 2020-08-15 DIAGNOSIS — R009 Unspecified abnormalities of heart beat: Secondary | ICD-10-CM

## 2020-08-15 NOTE — Progress Notes (Signed)
Cardiology Office Note:    Date:  08/15/2020   ID:  Christina Marquez, DOB Jan 10, 1986, MRN [id number]  PCP:  Trey Sailors, PA-C   [facility] Providers Cardiologist:  Debbe Odea, MD     Referring MD: Trey Sailors, PA-C   Chief Complaint  Patient presents with  . New Patient (Initial Visit)    ED follow up for palpitations. Meds reviewed verbally with patient.     History of Present Illness:    Christina Marquez is a 35 y.o. female with history of current smoking x15 years, who presents due to cardiac abnormality.  Patient states having abnormal heartbeats for over a year now.  Symptoms usually occur when patient is about to go to sleep.  She states her heartbeat will cause for couple of seconds prior to going back to normal rhythm.  Denies palpitations or fast heart rates.  Denies dizziness, shortness of breath, chest pain associated with symptoms.  Symptoms have caused patient to be worried, causing difficulty going to sleep.  States her mother has a history of congestive heart failure.  Twisted left ankle, possible fracture.  History reviewed. No pertinent past medical history.  Past Surgical History:  Procedure Laterality Date  . CESAREAN SECTION  2003  . LEEP      Current Medications: No outpatient medications have been marked as taking for the 08/15/20 encounter (Office Visit) with Debbe Odea, MD.     Allergies:   Patient has no known allergies.   Social History   Socioeconomic History  . Marital status: Divorced    Spouse name: Not on file  . Number of children: 2  . Years of education: Not on file  . Highest education level: Not on file  Occupational History  . Not on file  Tobacco Use  . Smoking status: Current Every Day Smoker    Packs/day: 0.50    Types: Cigarettes  . Smokeless tobacco: Never Used  Vaping Use  . Vaping Use: Never used  Substance and Sexual Activity  . Alcohol use: Yes    Alcohol/week: 26.0 standard drinks    Types:  24 Cans of beer, 2 Shots of liquor per week    Comment: weekends   . Drug use: Yes    Frequency: 7.0 times per week    Types: Marijuana  . Sexual activity: Yes    Partners: Male    Birth control/protection: None  Other Topics Concern  . Not on file  Social History Narrative  . Not on file   Social Determinants of Health   Financial Resource Strain: Not on file  Food Insecurity: Not on file  Transportation Needs: Not on file  Physical Activity: Not on file  Stress: Not on file  Social Connections: Not on file     Family History: The patient's family history includes Allergies in her mother; Anxiety disorder in her brother; Asthma in her daughter, mother, and son; Breast cancer in her maternal aunt; Depression in her father and mother; Diabetes in her mother; Emphysema in her father and mother; Heart attack in her maternal aunt and maternal grandmother; Hypertension in her mother; Leukemia in her maternal grandfather; Lung cancer in her maternal grandmother; Post-traumatic stress disorder in her brother; Seizures in her father.  ROS:   Please see the history of present illness.     All other systems reviewed and are negative.  EKGs/Labs/Other Studies Reviewed:    The following studies were reviewed today:   EKG:  EKG is  ordered today.  The ekg ordered today demonstrates normal sinus rhythm, normal ECG.  Recent Labs: 07/24/2020: BUN 11; Creatinine, Ser 0.82; Hemoglobin 14.4; Platelets 297; Potassium 3.9; Sodium 139  Recent Lipid Panel No results found for: CHOL, TRIG, HDL, CHOLHDL, VLDL, LDLCALC, LDLDIRECT   Risk Assessment/Calculations:      Physical Exam:    VS:  BP 106/72 (BP Location: Right Arm, Patient Position: Sitting, Cuff Size: Normal)   Pulse 85   Ht 5\' 6"  (1.676 m)   Wt 189 lb (85.7 kg)   SpO2 97%   BMI 30.51 kg/m     Wt Readings from Last 3 Encounters:  08/15/20 189 lb (85.7 kg)  08/03/20 189 lb (85.7 kg)  07/30/20 189 lb (85.7 kg)     GEN:   Well nourished, well developed in no acute distress HEENT: Normal NECK: No JVD; No carotid bruits LYMPHATICS: No lymphadenopathy CARDIAC: RRR, no murmurs, rubs, gallops RESPIRATORY:  Clear to auscultation without rales, wheezing or rhonchi  ABDOMEN: Soft, non-tender, non-distended MUSCULOSKELETAL:  No edema; left foot in cast. SKIN: Warm and dry NEUROLOGIC:  Alert and oriented x 3 PSYCHIATRIC:  Normal affect   ASSESSMENT:    1. Abnormality of heart beat   2. Smoking    PLAN:    In order of problems listed above:  1. Patient with abnormal heartbeat.  Possibly PACs with compensatory pauses.  We will place a cardiac monitor x1 week to evaluate any significant arrhythmias.  Has no other cardiac symptoms. 2. Current smoker, cessation advised.  Follow-up after cardiac monitor.     Medication Adjustments/Labs and Tests Ordered: Current medicines are reviewed at length with the patient today.  Concerns regarding medicines are outlined above.  Orders Placed This Encounter  Procedures  . LONG TERM MONITOR (3-14 DAYS)  . EKG 12-Lead   No orders of the defined types were placed in this encounter.   Patient Instructions  Medication Instructions:  None ordered *If you need a refill on your cardiac medications before your next appointment, please call your pharmacy*   Lab Work: None ordered If you have labs (blood work) drawn today and your tests are completely normal, you will receive your results only by: 09/29/20 MyChart Message (if you have MyChart) OR . A paper copy in the mail If you have any lab test that is abnormal or we need to change your treatment, we will call you to review the results.   Testing/Procedures:  1.  Your physician has recommended that you wear a Zio XT monitor for 1 week.   This monitor is a medical device that records the heart's electrical activity. Doctors most often use these monitors to diagnose arrhythmias. Arrhythmias are problems with the speed or  rhythm of the heartbeat. The monitor is a small device applied to your chest. You can wear one while you do your normal daily activities. While wearing this monitor if you have any symptoms to push the button and record what you felt. Once you have worn this monitor for the period of time provider prescribed (Usually 14 days), you will return the monitor device in the postage paid box. Once it is returned they will download the data collected and provide Marland Kitchen with a report which the provider will then review and we will call you with those results. Important tips:  1. Avoid showering during the first 24 hours of wearing the monitor. 2. Avoid excessive sweating to help maximize wear time. 3. Do not submerge the device, no  hot tubs, and no swimming pools. 4. Keep any lotions or oils away from the patch. 5. After 24 hours you may shower with the patch on. Take brief showers with your back facing the shower head.  6. Do not remove patch once it has been placed because that will interrupt data and decrease adhesive wear time. 7. Push the button when you have any symptoms and write down what you were feeling. 8. Once you have completed wearing your monitor, remove and place into box which has postage paid and place in your outgoing mailbox.  9. If for some reason you have misplaced your box then call our office and we can provide another box and/or mail it off for you.      Follow-Up: At [facility], you and your health needs are our priority.  As part of our continuing mission to provide you with exceptional heart care, we have created designated Provider Care Teams.  These Care Teams include your primary Cardiologist (physician) and Advanced Practice Providers (APPs -  Physician Assistants and Nurse Practitioners) who all work together to provide you with the care you need, when you need it.  We recommend signing up for the patient portal called "MyChart".  Sign up information is provided on this  After Visit Summary.  MyChart is used to connect with patients for Virtual Visits (Telemedicine).  Patients are able to view lab/test results, encounter notes, upcoming appointments, etc.  Non-urgent messages can be sent to your provider as well.   To learn more about what you can do with MyChart, go to [email or web address].    Your next appointment:   5 week(s)  The format for your next appointment:   In Person  Provider:   Debbe Odea, MD   Other Instructions      Signed, Debbe Odea, MD  08/15/2020 12:30 PM    [facility]

## 2020-08-15 NOTE — Patient Instructions (Signed)
Medication Instructions:  None ordered *If you need a refill on your cardiac medications before your next appointment, please call your pharmacy*   Lab Work: None ordered If you have labs (blood work) drawn today and your tests are completely normal, you will receive your results only by: Marland Kitchen MyChart Message (if you have MyChart) OR . A paper copy in the mail If you have any lab test that is abnormal or we need to change your treatment, we will call you to review the results.   Testing/Procedures:  1.  Your physician has recommended that you wear a Zio XT monitor for 1 week.   This monitor is a medical device that records the heart's electrical activity. Doctors most often use these monitors to diagnose arrhythmias. Arrhythmias are problems with the speed or rhythm of the heartbeat. The monitor is a small device applied to your chest. You can wear one while you do your normal daily activities. While wearing this monitor if you have any symptoms to push the button and record what you felt. Once you have worn this monitor for the period of time provider prescribed (Usually 14 days), you will return the monitor device in the postage paid box. Once it is returned they will download the data collected and provide [place] with a report which the provider will then review and we will call you with those results. Important tips:  1. Avoid showering during the first 24 hours of wearing the monitor. 2. Avoid excessive sweating to help maximize wear time. 3. Do not submerge the device, no hot tubs, and no swimming pools. 4. Keep any lotions or oils away from the patch. 5. After 24 hours you may shower with the patch on. Take brief showers with your back facing the shower head.  6. Do not remove patch once it has been placed because that will interrupt data and decrease adhesive wear time. 7. Push the button when you have any symptoms and write down what you were feeling. 8. Once you have completed wearing  your monitor, remove and place into box which has postage paid and place in your outgoing mailbox.  9. If for some reason you have misplaced your box then call our office and we can provide another box and/or mail it off for you.      Follow-Up: At [facility], you and your health needs are our priority.  As part of our continuing mission to provide you with exceptional heart care, we have created designated Provider Care Teams.  These Care Teams include your primary Cardiologist (physician) and Advanced Practice Providers (APPs -  Physician Assistants and Nurse Practitioners) who all work together to provide you with the care you need, when you need it.  We recommend signing up for the patient portal called "MyChart".  Sign up information is provided on this After Visit Summary.  MyChart is used to connect with patients for Virtual Visits (Telemedicine).  Patients are able to view lab/test results, encounter notes, upcoming appointments, etc.  Non-urgent messages can be sent to your provider as well.   To learn more about what you can do with MyChart, go to [email or web address].    Your next appointment:   5 week(s)  The format for your next appointment:   In Person  Provider:   Debbe Odea, MD   Other Instructions

## 2020-09-05 ENCOUNTER — Telehealth: Payer: Self-pay

## 2020-09-05 NOTE — Telephone Encounter (Signed)
Margrett Rud, [place]  09/05/2020 12:47 PM EDT Back to Top     Attempted to contact patient with Monitor Results.  No answer. LMOV to call back.  Phone note started   Debbe Odea, MD  09/04/2020  5:19 PM EDT      Normal cardiac monitor, no significant arrhythmias, no heart blocks noted.

## 2020-09-05 NOTE — Telephone Encounter (Signed)
The patient has been notified of the result and verbalized understanding.  All questions (if any) were answered. Margrett Rud, [place] 09/05/2020 12:52 PM

## 2020-09-10 ENCOUNTER — Other Ambulatory Visit: Payer: Self-pay

## 2020-09-10 ENCOUNTER — Emergency Department
Admission: EM | Admit: 2020-09-10 | Discharge: 2020-09-10 | Disposition: A | Discharge status: Home / Self Care | Payer: Medicaid Other | Attending: Student in an Organized Health Care Education/Training Program | Admitting: Student in an Organized Health Care Education/Training Program

## 2020-09-10 ENCOUNTER — Emergency Department: Payer: Medicaid Other

## 2020-09-10 DIAGNOSIS — F17210 Nicotine dependence, cigarettes, uncomplicated: Secondary | ICD-10-CM | POA: Diagnosis not present

## 2020-09-10 DIAGNOSIS — R079 Chest pain, unspecified: Secondary | ICD-10-CM

## 2020-09-10 DIAGNOSIS — R0781 Pleurodynia: Secondary | ICD-10-CM | POA: Diagnosis not present

## 2020-09-10 DIAGNOSIS — Z86711 Personal history of pulmonary embolism: Secondary | ICD-10-CM | POA: Diagnosis not present

## 2020-09-10 LAB — CBC
HCT: 43.2 % (ref 36.0–46.0)
Hemoglobin: 14.3 g/dL (ref 12.0–15.0)
MCH: 29.5 pg (ref 26.0–34.0)
MCHC: 33.1 g/dL (ref 30.0–36.0)
MCV: 89.3 fL (ref 80.0–100.0)
Platelets: 285 10*3/uL (ref 150–400)
RBC: 4.84 MIL/uL (ref 3.87–5.11)
RDW: 14.2 % (ref 11.5–15.5)
WBC: 13.3 10*3/uL — ABNORMAL HIGH (ref 4.0–10.5)
nRBC: 0 % (ref 0.0–0.2)

## 2020-09-10 LAB — TROPONIN I (HIGH SENSITIVITY)
Troponin I (High Sensitivity): 3 ng/L (ref <18)
Troponin I (High Sensitivity): 3 ng/L (ref <18)

## 2020-09-10 LAB — BASIC METABOLIC PANEL
Anion gap: 9 (ref 5–15)
BUN: 20 mg/dL (ref 6–20)
CO2: 22 mmol/L (ref 22–32)
Calcium: 8.4 mg/dL — ABNORMAL LOW (ref 8.9–10.3)
Chloride: 107 mmol/L (ref 98–111)
Creatinine, Ser: 0.82 mg/dL (ref 0.44–1.00)
GFR, Estimated: >60 mL/min (ref >60)
Glucose, Bld: 99 mg/dL (ref 70–99)
Potassium: 3.9 mmol/L (ref 3.5–5.1)
Sodium: 138 mmol/L (ref 135–145)

## 2020-09-10 IMAGING — US US EXTREM LOW VENOUS*L*
1 series · 14 of 24 positions shown · non-contrast
Comparison: None.

CLINICAL DATA: Chest pain.

EXAM:
Left LOWER EXTREMITY VENOUS DOPPLER ULTRASOUND
TECHNIQUE: Gray-scale sonography with compression, as well as color and duplex
ultrasound, were performed to evaluate the deep venous system(s)
from the level of the common femoral vein through the popliteal and
proximal calf veins.

[Series 1: us venous img lower uni left (dvt) · portal-venous · 14 of 33 slices shown]
[im 1/33]
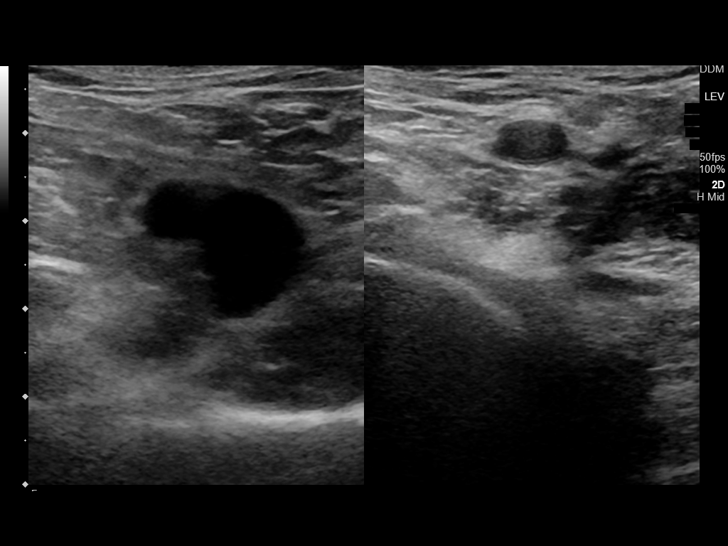
[im 3/33]
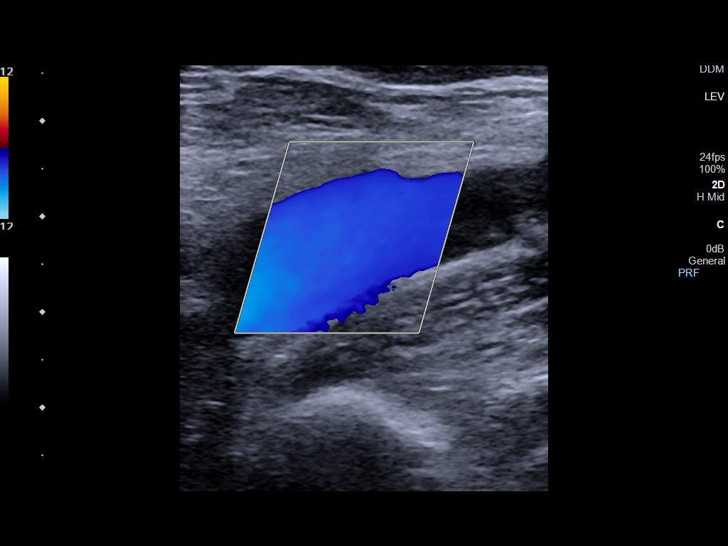
[im 6/33]
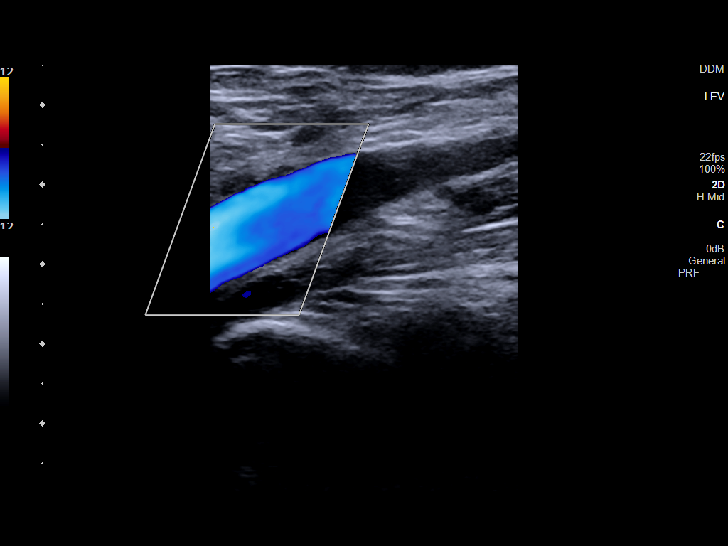
[im 9/33]
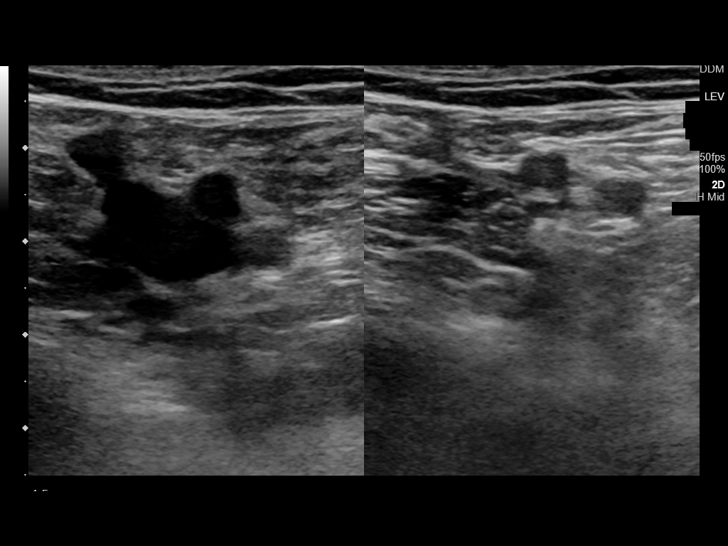
[im 10/33]
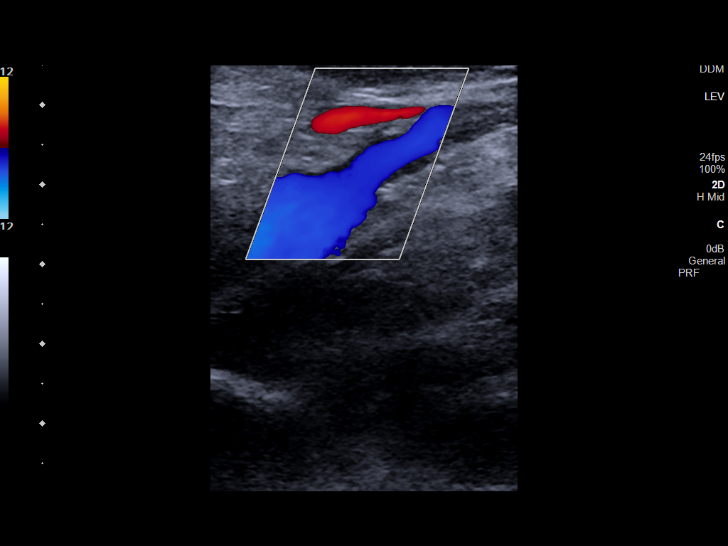
[im 13/33]
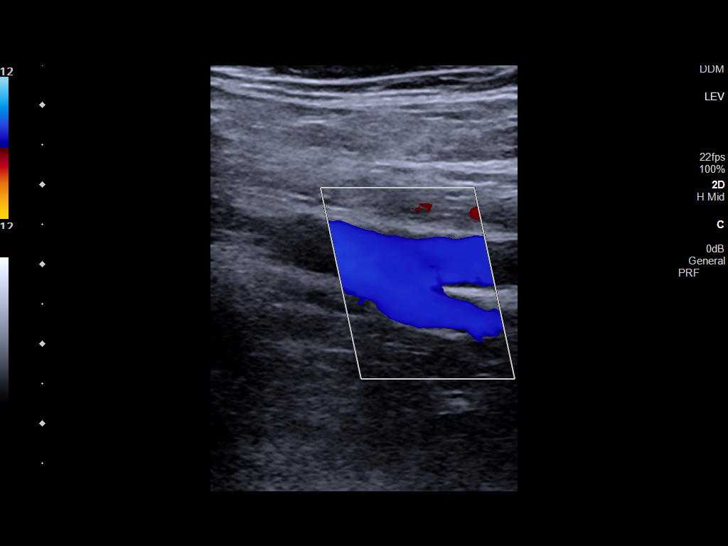
[im 16/33]
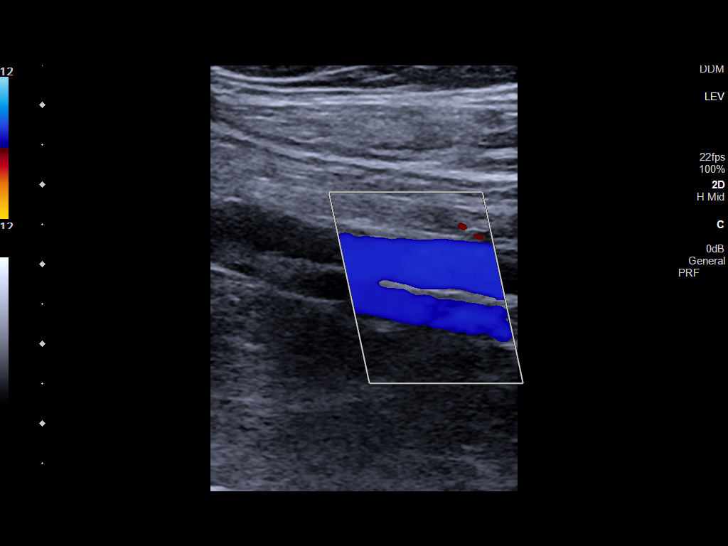
[im 17/33]
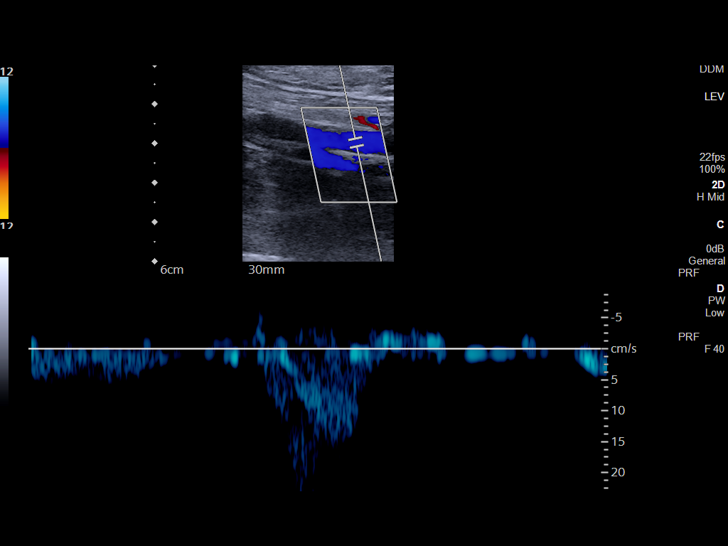
[im 20/33]
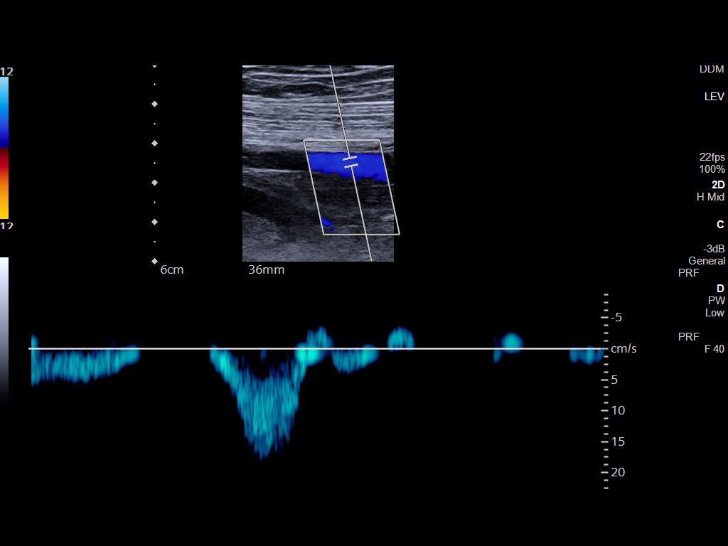
[im 23/33]
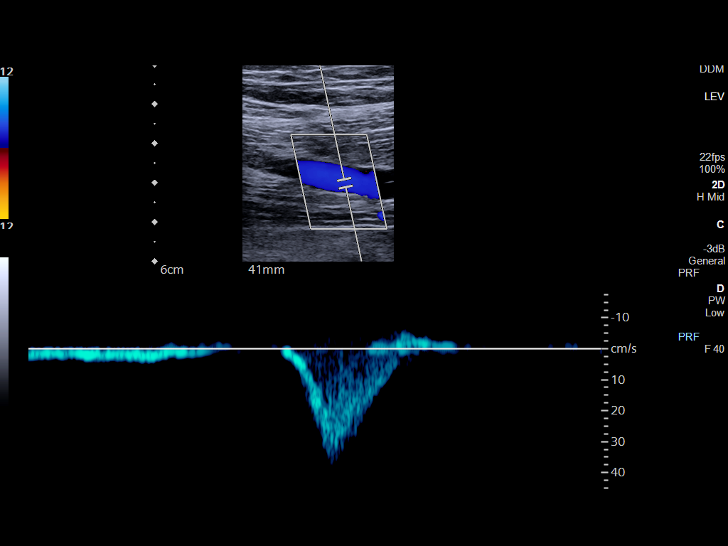
[im 26/33]
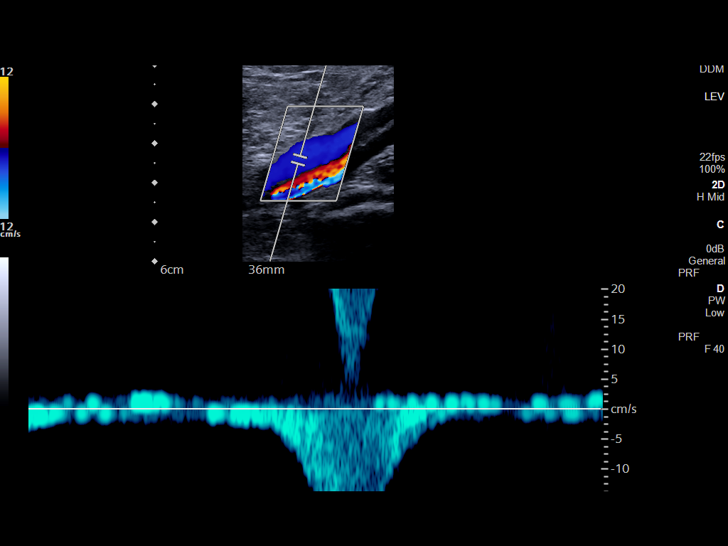
[im 27/33]
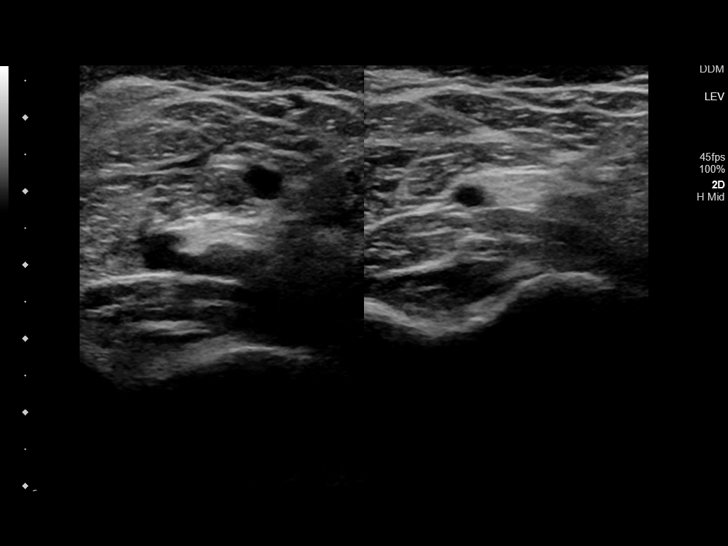
[im 30/33]
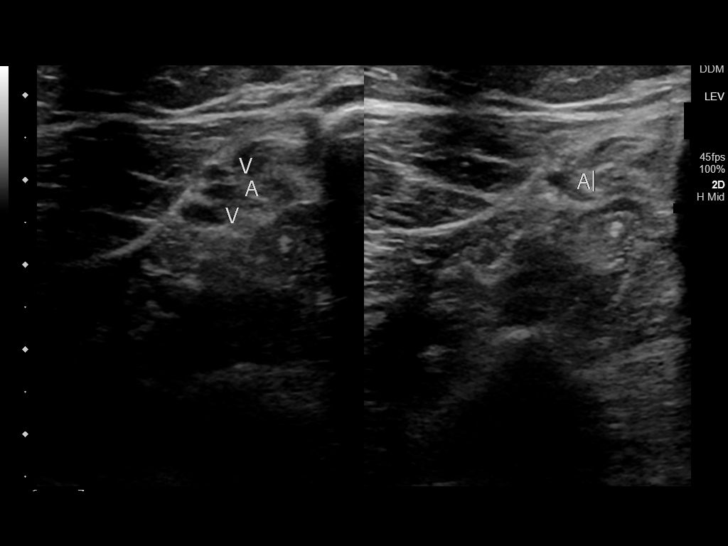
[im 33/33]
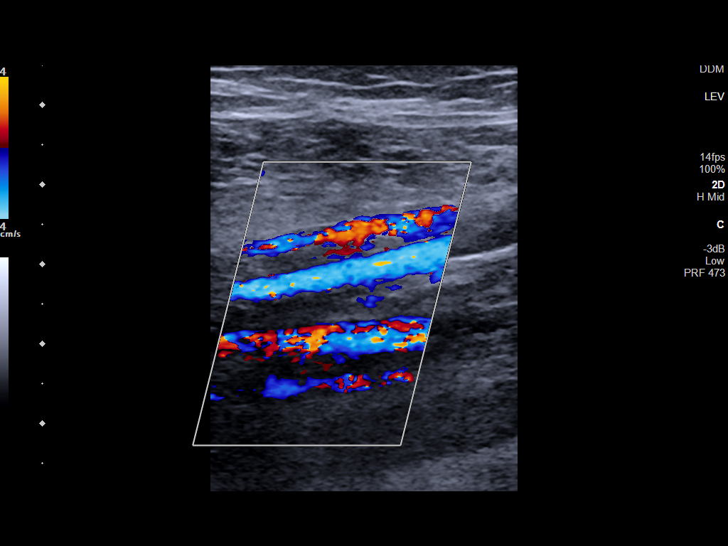

[14 of 24 positions shown; findings below may reference images not displayed]

FINDINGS: VENOUS

Normal compressibility of the common femoral, superficial femoral,
and popliteal veins, as well as the visualized calf veins.
Visualized portions of profunda femoral vein and great saphenous
vein unremarkable. No filling defects to suggest DVT on grayscale or
color Doppler imaging. Doppler waveforms show normal direction of
venous flow, normal respiratory plasticity and response to
augmentation.

Limited views of the contralateral common femoral vein are
unremarkable.

OTHER

None.

Limitations: none
IMPRESSION: Negative left lower extremity venous Doppler examination for deep
venous thrombosis.

## 2020-09-10 IMAGING — CT CT ANGIO CHEST
2 of 6 series · 19 of 46 positions shown · IV contrast (APPLIED)
Comparison: Chest radiograph [DATE]

CLINICAL DATA: High clinical probability of pulmonary embolus.

EXAM:
CT ANGIOGRAPHY CHEST WITH CONTRAST
TECHNIQUE: Multidetector CT imaging of the chest was performed using the
standard protocol during bolus administration of intravenous
contrast. Multiplanar CT image reconstructions and MIPs were
obtained to evaluate the vascular anatomy.
CONTRAST:  75mL OMNIPAQUE IOHEXOL 350 MG/ML SOLN

[Series 5: thins · axial · 0.68mm/px · z∈[-328,-76]mm · 17 of 278 slices shown]
[im 13/278  lung]
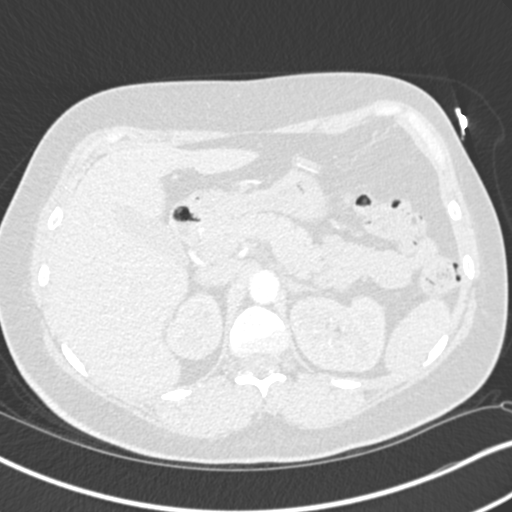
[im 25/278  soft-tissue]
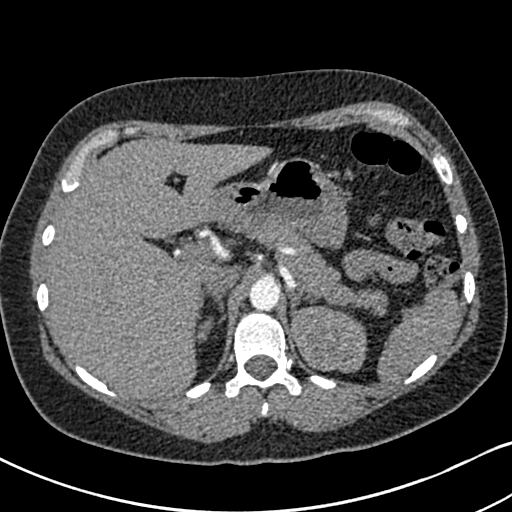
[im 49/278  lung]
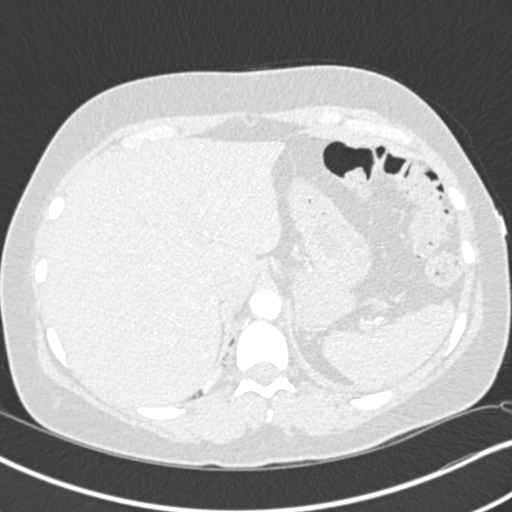
[im 61/278  soft-tissue]
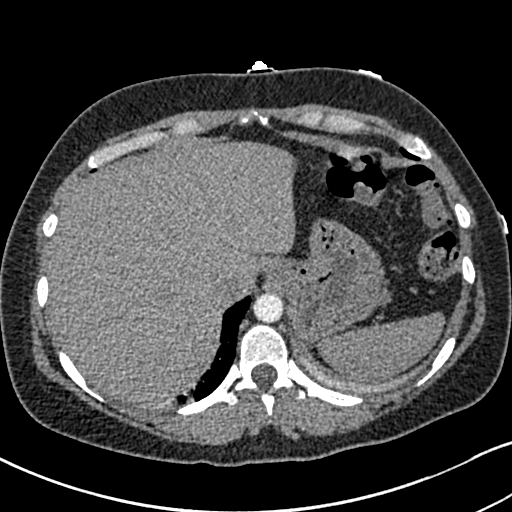
[im 73/278  lung]
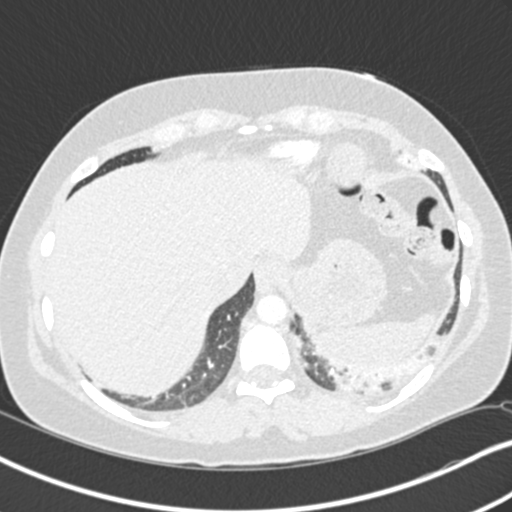
[im 97/278  soft-tissue]
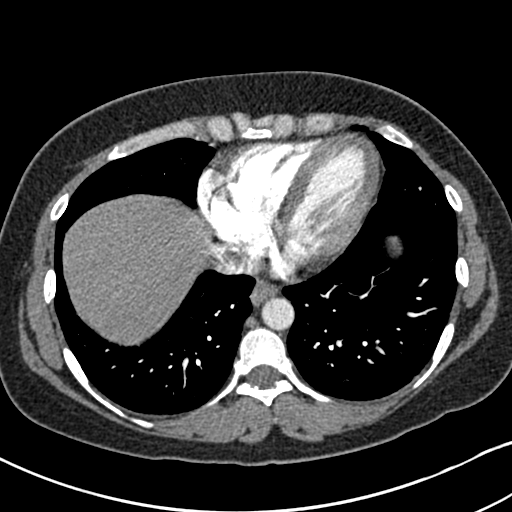
[im 109/278  lung]
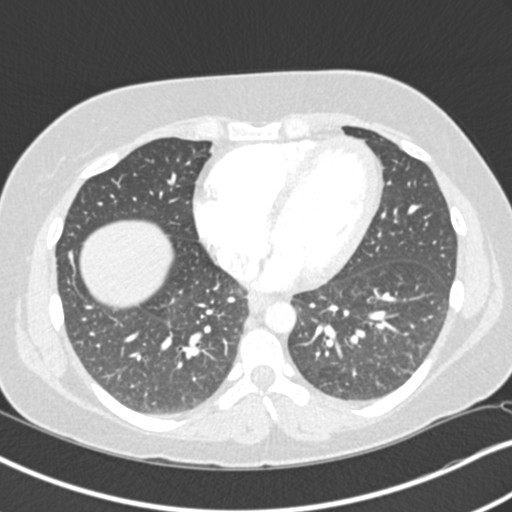
[im 121/278  soft-tissue]
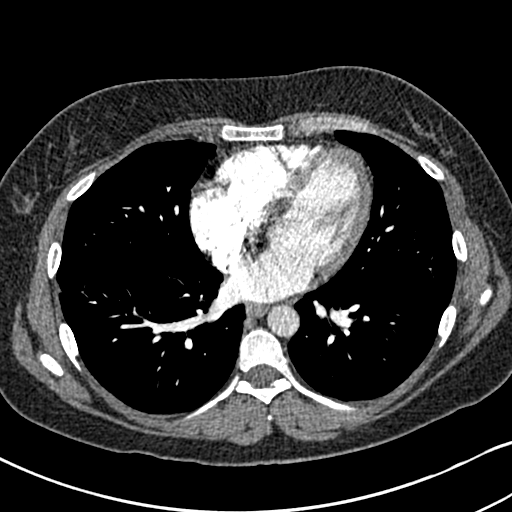
[im 145/278  lung]
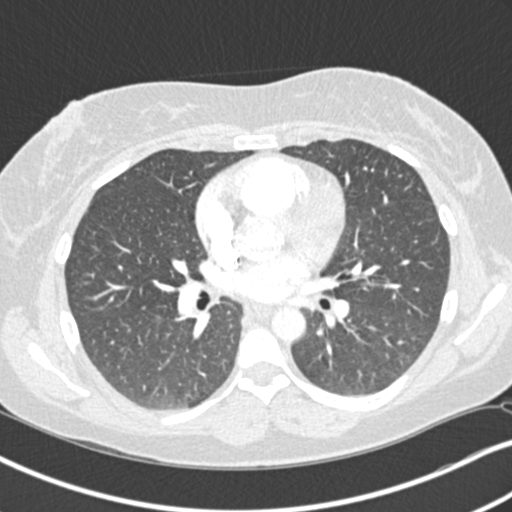
[im 157/278  soft-tissue]
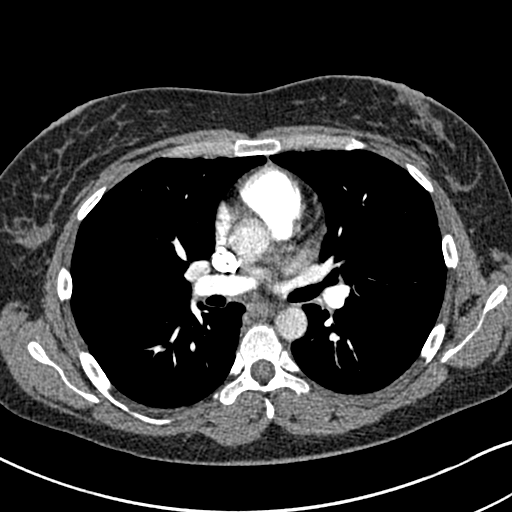
[im 169/278  lung]
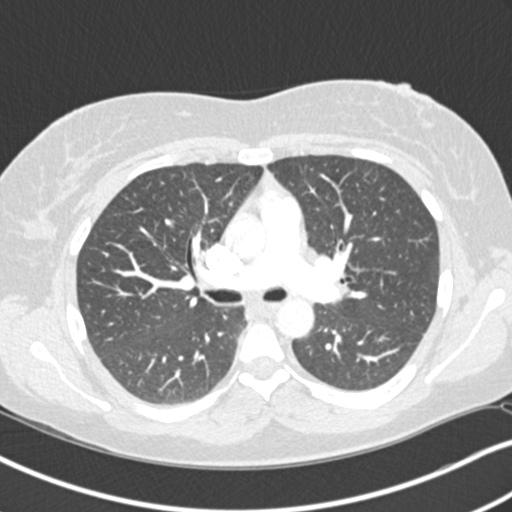
[im 181/278  soft-tissue]
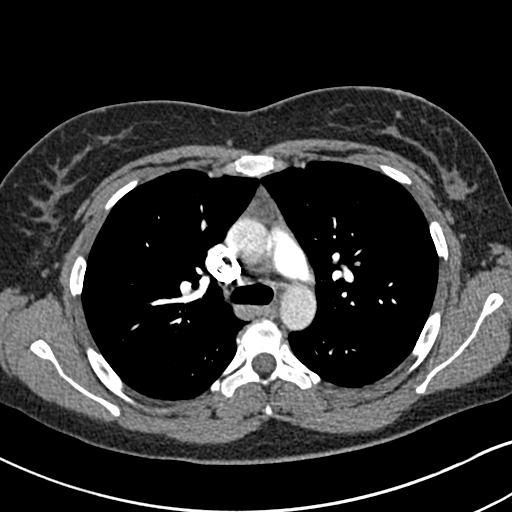
[im 205/278  lung]
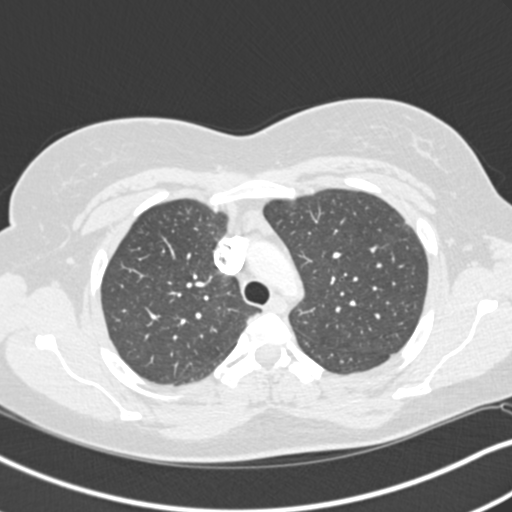
[im 217/278  soft-tissue]
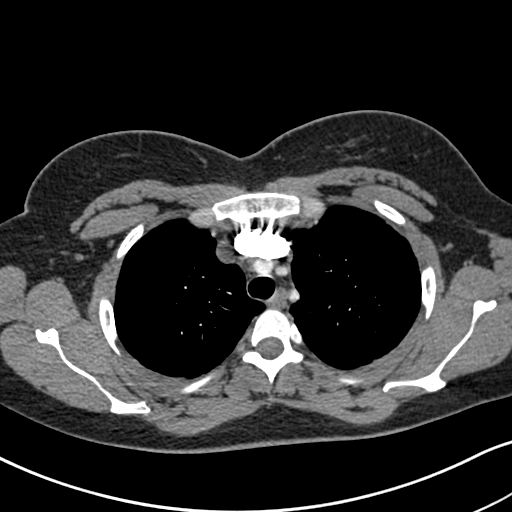
[im 229/278  lung]
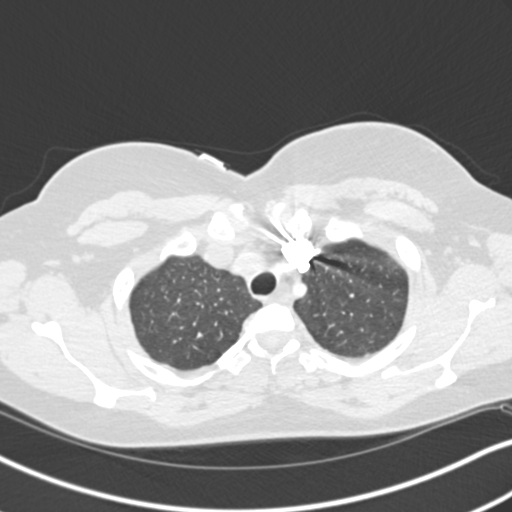
[im 253/278  soft-tissue]
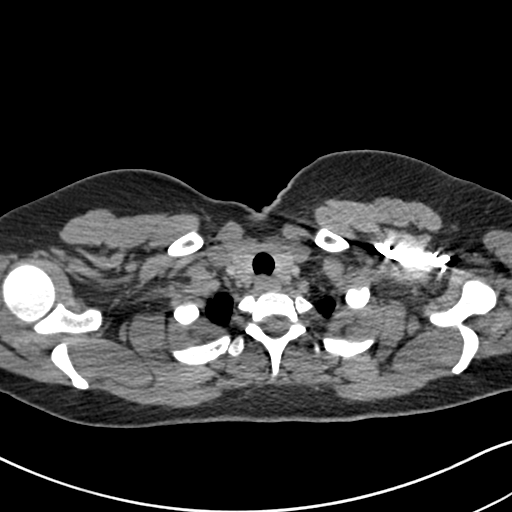
[im 265/278  lung]
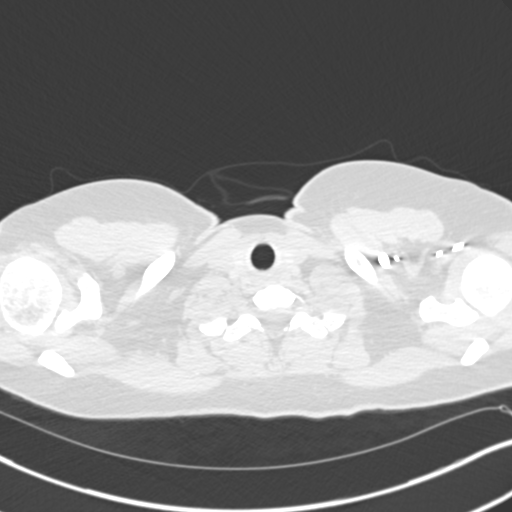

[Series 7: coronal mpr · coronal · 0.56mm/px · 2 of 91 slices shown]
[im 31/91  soft-tissue]
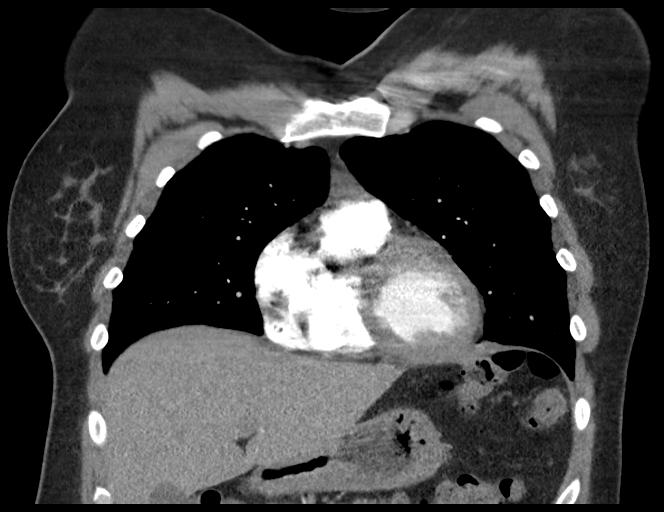
[im 61/91  soft-tissue]
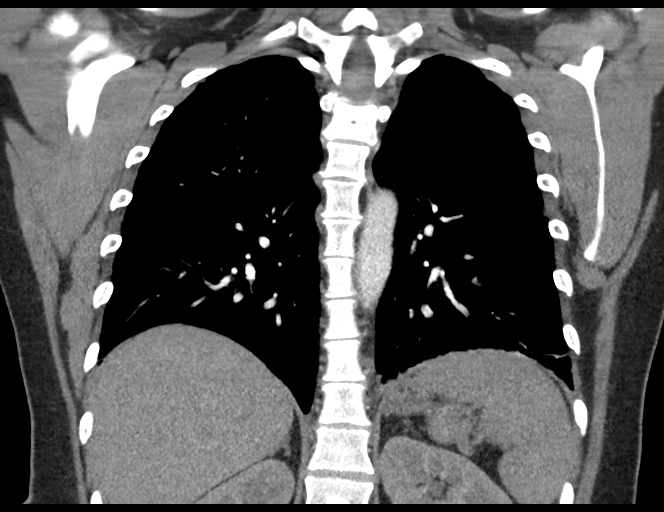

[19 of 46 positions shown; findings below may reference images not displayed]

FINDINGS: Cardiovascular: Satisfactory opacification of the pulmonary arteries
to the segmental level. No evidence of pulmonary embolism. Normal
heart size. No pericardial effusion.

Mediastinum/Nodes: No enlarged mediastinal, hilar, or axillary lymph
nodes. Thyroid gland, trachea, and esophagus demonstrate no
significant findings.

Lungs/Pleura: Mild left lower lobe atelectasis versus airspace
consolidation. No pleural effusion or pneumothorax.

Upper Abdomen: No acute abnormality.

Musculoskeletal: No chest wall abnormality. No acute or significant
osseous findings.

Review of the MIP images confirms the above findings.
IMPRESSION: 1. No evidence of pulmonary embolus.
2. Mild left lower lobe atelectasis versus airspace consolidation.

## 2020-09-10 MED ORDER — AMOXICILLIN 500 MG PO CAPS: 500.0000 mg | ORAL_CAPSULE | Freq: Three times a day (TID) | ORAL | 0 refills | Status: AC

## 2020-09-10 MED ORDER — IOHEXOL 350 MG/ML SOLN
75.0000 mL | Freq: Once | INTRAVENOUS | Status: AC | PRN
  Administered 2020-09-10: 75 mL via INTRAVENOUS
  Filled 2020-09-10: qty 75

## 2020-09-10 MED ORDER — AZITHROMYCIN 500 MG PO TABS: 500.0000 mg | ORAL_TABLET | Freq: Every day | ORAL | 0 refills | Status: AC
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
## 2020-09-10 NOTE — ED Notes (Signed)
See triage note  States she developed some left sided chest discomfort that woke her up around 3 am  Pain is worse with inspiration  States she was given ASA by EMS which she thinks helped the pain  Denies any fever or cough

## 2020-09-10 NOTE — ED Triage Notes (Signed)
Pt to ED via EMS from home, pt state she woke up with chest pain this morning, pain radiates to upper back and shoulders, pt states pain is worse when taking a deep breath and coughing. Pt was wearing heart monitor last month because she was having palpitations, pt states nothing was found.

## 2020-09-23 ENCOUNTER — Ambulatory Visit: Payer: Self-pay | Admitting: Cardiology

## 2020-09-24 ENCOUNTER — Encounter: Payer: Self-pay | Admitting: Cardiology

## 2020-11-23 ENCOUNTER — Emergency Department
Admission: EM | Admit: 2020-11-23 | Discharge: 2020-11-23 | Disposition: A | Discharge status: Home / Self Care | Payer: Medicaid Other | Attending: Emergency Medicine | Admitting: Emergency Medicine

## 2020-11-23 ENCOUNTER — Encounter: Payer: Self-pay | Admitting: Emergency Medicine

## 2020-11-23 ENCOUNTER — Other Ambulatory Visit: Payer: Self-pay

## 2020-11-23 ENCOUNTER — Emergency Department: Payer: Medicaid Other

## 2020-11-23 DIAGNOSIS — S62654A Nondisplaced fracture of medial phalanx of right ring finger, initial encounter for closed fracture: Secondary | ICD-10-CM | POA: Diagnosis not present

## 2020-11-23 DIAGNOSIS — F17210 Nicotine dependence, cigarettes, uncomplicated: Secondary | ICD-10-CM | POA: Diagnosis not present

## 2020-11-23 DIAGNOSIS — Y9316 Activity, rowing, canoeing, kayaking, rafting and tubing: Secondary | ICD-10-CM | POA: Insufficient documentation

## 2020-11-23 DIAGNOSIS — X58XXXA Exposure to other specified factors, initial encounter: Secondary | ICD-10-CM | POA: Diagnosis not present

## 2020-11-23 DIAGNOSIS — Y92838 Other recreation area as the place of occurrence of the external cause: Secondary | ICD-10-CM | POA: Insufficient documentation

## 2020-11-23 DIAGNOSIS — S6991XA Unspecified injury of right wrist, hand and finger(s), initial encounter: Secondary | ICD-10-CM | POA: Diagnosis present

## 2020-11-23 MED ORDER — HYDROCODONE-ACETAMINOPHEN 5-325 MG PO TABS: 1.0000 | ORAL_TABLET | ORAL | 0 refills | Status: DC | PRN
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
## 2020-11-23 NOTE — ED Triage Notes (Signed)
[place] ring finger. Pt reports pretty sure they are broke. Pt states her friend who is a [employer] buddy taped 2 of her fingers together.

## 2021-01-20 ENCOUNTER — Ambulatory Visit: Payer: Medicaid Other | Admitting: Physician Assistant

## 2021-01-20 ENCOUNTER — Other Ambulatory Visit: Payer: Self-pay

## 2021-01-20 DIAGNOSIS — B9689 Other specified bacterial agents as the cause of diseases classified elsewhere: Secondary | ICD-10-CM

## 2021-01-20 DIAGNOSIS — Z113 Encounter for screening for infections with a predominantly sexual mode of transmission: Secondary | ICD-10-CM

## 2021-01-20 DIAGNOSIS — N760 Acute vaginitis: Secondary | ICD-10-CM

## 2021-01-20 LAB — WET PREP FOR TRICH, YEAST, CLUE
Trichomonas Exam: NEGATIVE
Yeast Exam: NEGATIVE

## 2021-01-20 MED ORDER — METRONIDAZOLE 500 MG PO TABS: 2000.0000 mg | ORAL_TABLET | Freq: Once | ORAL | 0 refills | Status: AC

## 2021-01-21 ENCOUNTER — Encounter: Payer: Self-pay | Admitting: Physician Assistant
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
## 2021-01-24 LAB — GONOCOCCUS CULTURE

## 2021-01-25 NOTE — Progress Notes (Signed)
Chart reviewed by Pharmacist  Suzanne Walker PharmD, Contract Pharmacist at [facility]  

## 2021-04-19 ENCOUNTER — Emergency Department
Admission: EM | Admit: 2021-04-19 | Discharge: 2021-04-19 | Disposition: A | Discharge status: Home / Self Care | Payer: Medicaid Other | Attending: Emergency Medicine | Admitting: Emergency Medicine

## 2021-04-19 ENCOUNTER — Other Ambulatory Visit: Payer: Self-pay

## 2021-04-19 ENCOUNTER — Emergency Department: Payer: Medicaid Other

## 2021-04-19 DIAGNOSIS — R221 Localized swelling, mass and lump, neck: Secondary | ICD-10-CM | POA: Diagnosis present

## 2021-04-19 DIAGNOSIS — M542 Cervicalgia: Secondary | ICD-10-CM | POA: Diagnosis not present

## 2021-04-19 DIAGNOSIS — I10 Essential (primary) hypertension: Secondary | ICD-10-CM | POA: Diagnosis not present

## 2021-04-19 LAB — CBC WITH DIFFERENTIAL/PLATELET
Abs Immature Granulocytes: 0.07 10*3/uL (ref 0.00–0.07)
Basophils Absolute: 0.1 10*3/uL (ref 0.0–0.1)
Basophils Relative: 1 %
Eosinophils Absolute: 0.1 10*3/uL (ref 0.0–0.5)
Eosinophils Relative: 1 %
HCT: 45 % (ref 36.0–46.0)
Hemoglobin: 14.7 g/dL (ref 12.0–15.0)
Immature Granulocytes: 1 %
Lymphocytes Relative: 26 %
Lymphs Abs: 3.7 10*3/uL (ref 0.7–4.0)
MCH: 29.3 pg (ref 26.0–34.0)
MCHC: 32.7 g/dL (ref 30.0–36.0)
MCV: 89.8 fL (ref 80.0–100.0)
Monocytes Absolute: 1.1 10*3/uL — ABNORMAL HIGH (ref 0.1–1.0)
Monocytes Relative: 8 %
Neutro Abs: 9.2 10*3/uL — ABNORMAL HIGH (ref 1.7–7.7)
Neutrophils Relative %: 63 %
Platelets: 291 10*3/uL (ref 150–400)
RBC: 5.01 MIL/uL (ref 3.87–5.11)
RDW: 14.4 % (ref 11.5–15.5)
WBC: 14.3 10*3/uL — ABNORMAL HIGH (ref 4.0–10.5)
nRBC: 0 % (ref 0.0–0.2)

## 2021-04-19 LAB — COMPREHENSIVE METABOLIC PANEL
ALT: 12 U/L (ref 0–44)
AST: 12 U/L — ABNORMAL LOW (ref 15–41)
Albumin: 3.6 g/dL (ref 3.5–5.0)
Alkaline Phosphatase: 60 U/L (ref 38–126)
Anion gap: 6 (ref 5–15)
BUN: 18 mg/dL (ref 6–20)
CO2: 25 mmol/L (ref 22–32)
Calcium: 8.8 mg/dL — ABNORMAL LOW (ref 8.9–10.3)
Chloride: 107 mmol/L (ref 98–111)
Creatinine, Ser: 0.77 mg/dL (ref 0.44–1.00)
GFR, Estimated: >60 mL/min (ref >60)
Glucose, Bld: 89 mg/dL (ref 70–99)
Potassium: 4.3 mmol/L (ref 3.5–5.1)
Sodium: 138 mmol/L (ref 135–145)
Total Bilirubin: 0.3 mg/dL (ref 0.3–1.2)
Total Protein: 6.4 g/dL — ABNORMAL LOW (ref 6.5–8.1)

## 2021-04-19 LAB — TSH: TSH: 1.259 u[IU]/mL (ref 0.350–4.500)

## 2021-04-19 LAB — T4, FREE: Free T4: 0.88 ng/dL (ref 0.61–1.12)

## 2021-04-19 IMAGING — CT CT NECK W/ CM
5 series · 16 of 35 positions shown, 18 images · IV contrast (agent unspecified)
Comparison: None.

CLINICAL DATA: Soft tissue swelling. Suspect infection. Neck
swelling.

EXAM:
CT NECK WITH CONTRAST
TECHNIQUE: Multidetector CT imaging of the neck was performed using the
standard protocol following the bolus administration of intravenous
contrast.

[Series 2: axial neck · axial · 0.61mm/px · z∈[+298,+380]mm · 2 of 124 slices shown]
[im 42/124  bone]
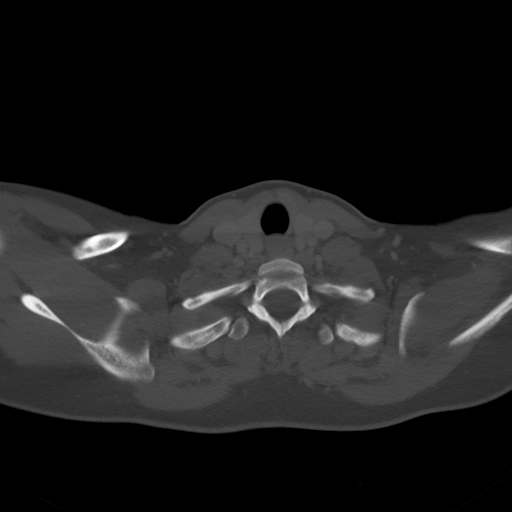
[im 83/124  bone]
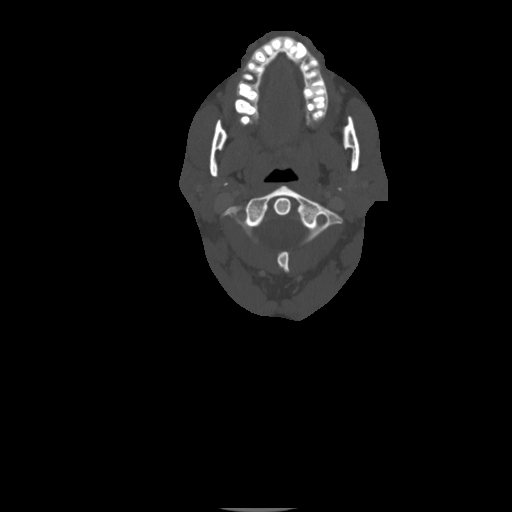

[Series 4: axial bone · axial · 0.61mm/px · z∈[+276,+400]mm · 3 of 124 slices shown]
[im 31/124  bone]
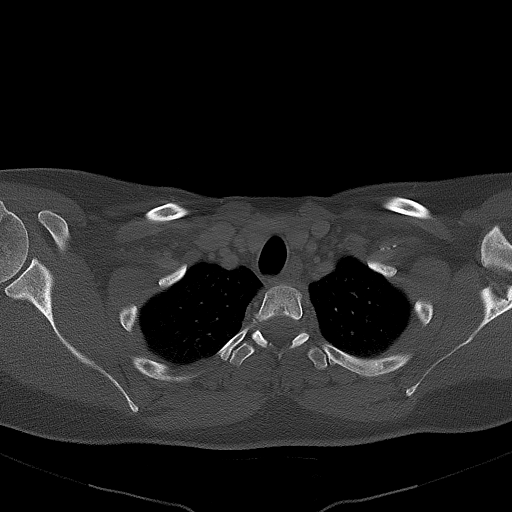
[im 62/124  bone]
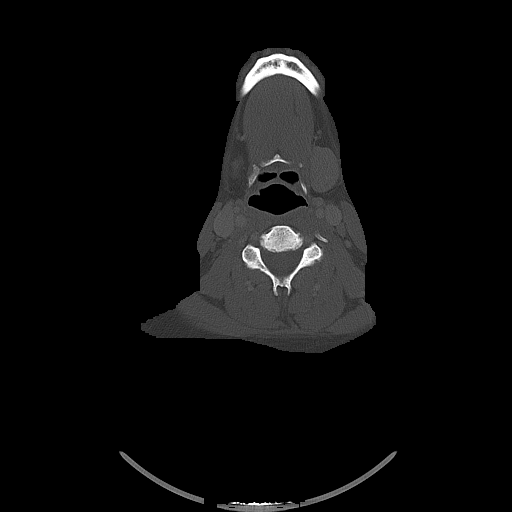
[im 93/124  bone]
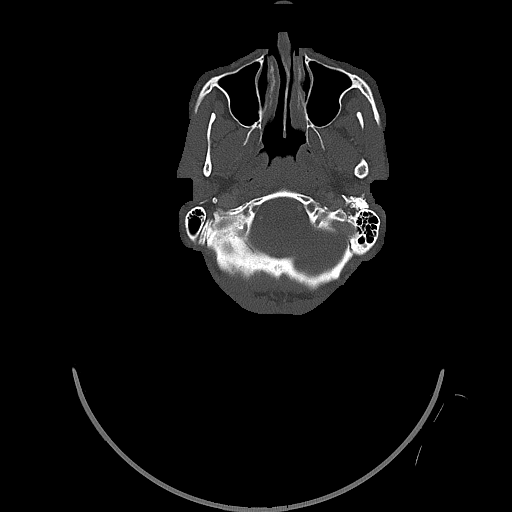

[Series 5: sag neck · sagittal · 0.52mm/px · 5 of 250 slices shown, 6 images]
[im 84/250  bone]
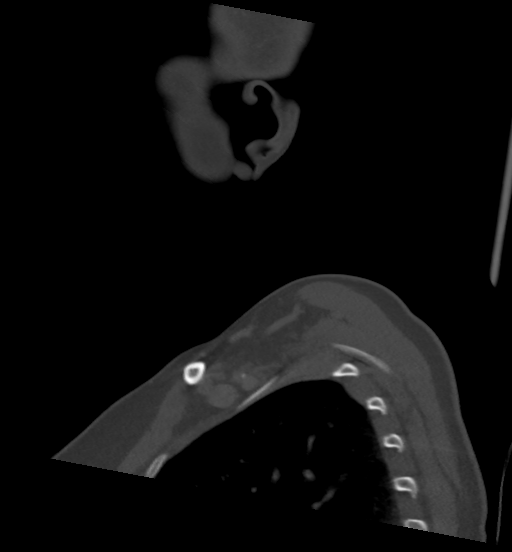
[im 104/250  bone]
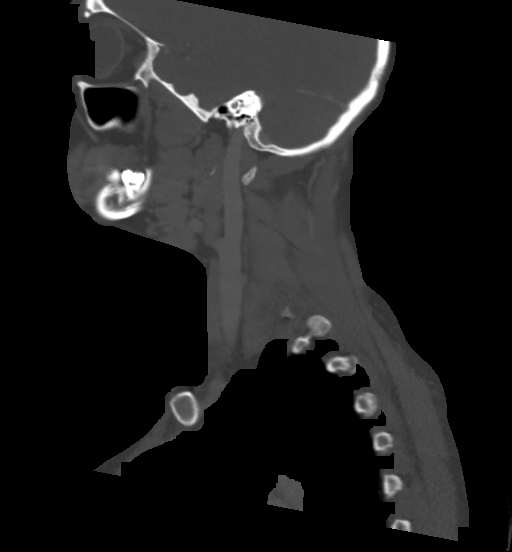
[im 125/250  soft-tissue]
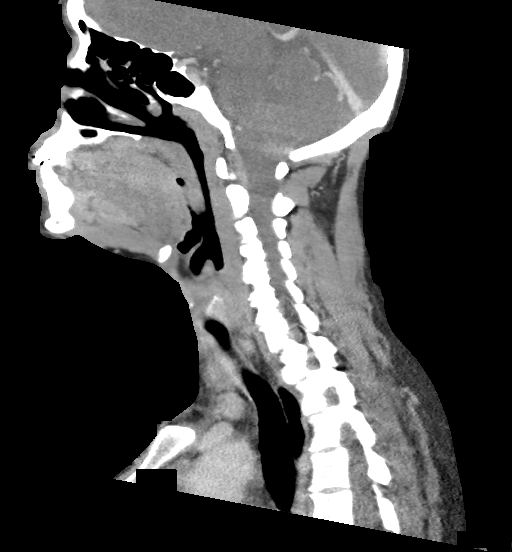
[im 125/250  bone]
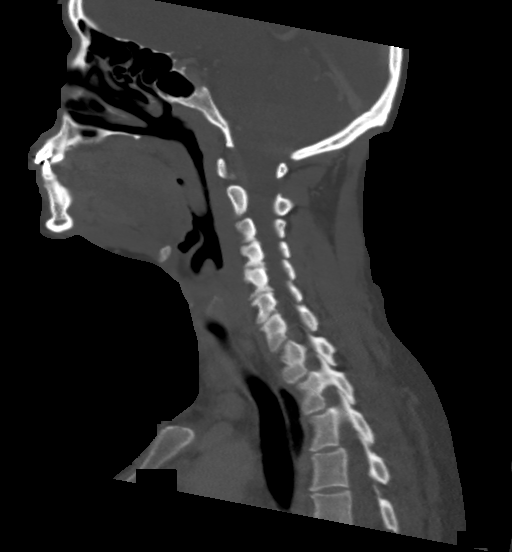
[im 146/250  bone]
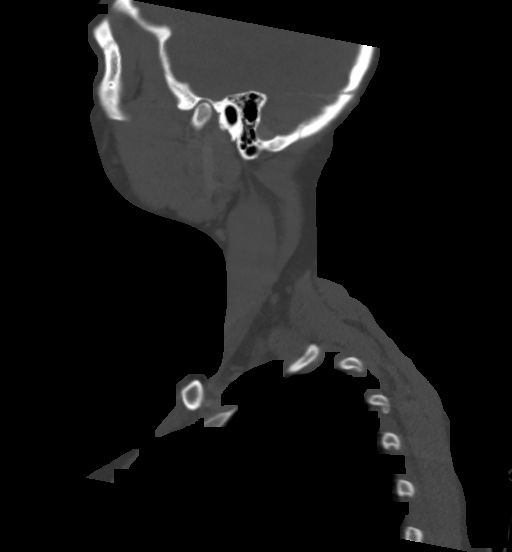
[im 167/250  bone]
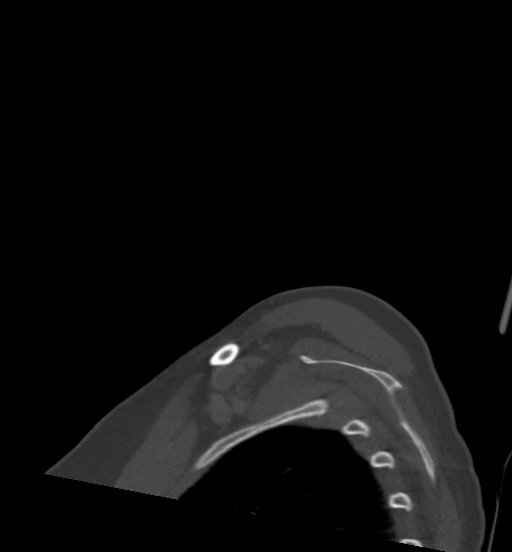

[Series 6: cor neck · coronal · 0.68mm/px · 3 of 145 slices shown]
[im 29/145  bone]
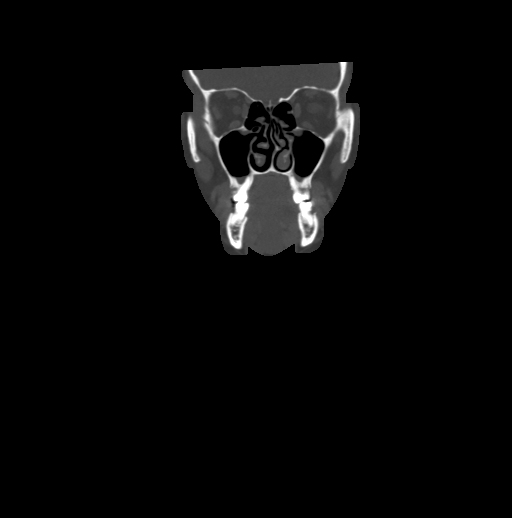
[im 58/145  bone]
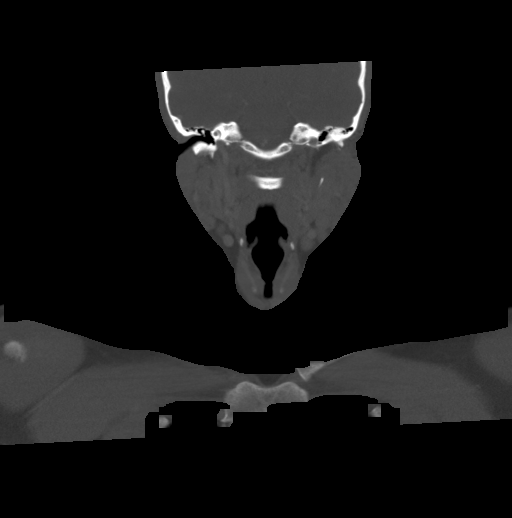
[im 87/145  bone]
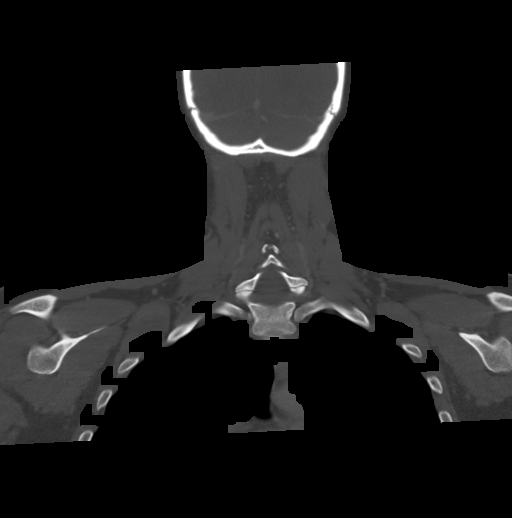

[Series 7: orthogonal (person_name) · axial · 0.65mm/px · z∈[+278,+410]mm · 3 of 132 slices shown, 4 images]
[im 33/132  soft-tissue]
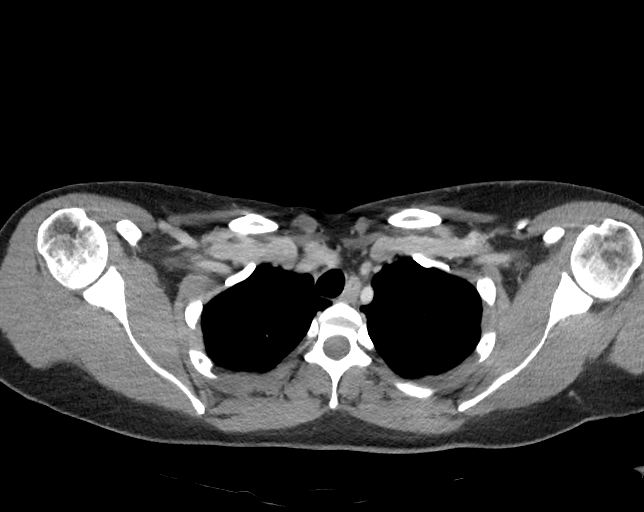
[im 33/132  bone]
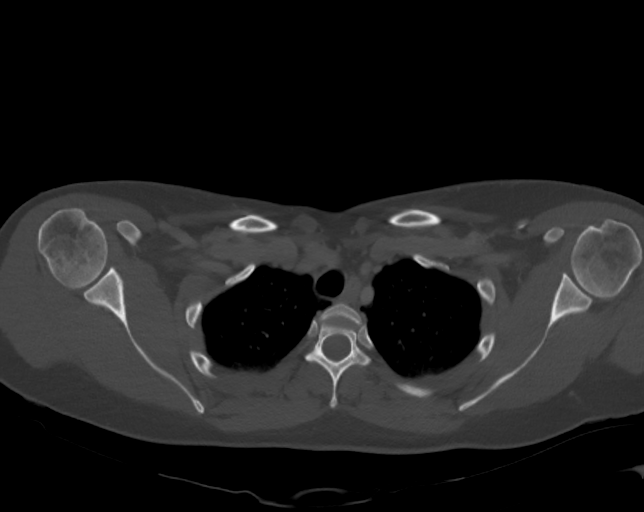
[im 66/132  bone]
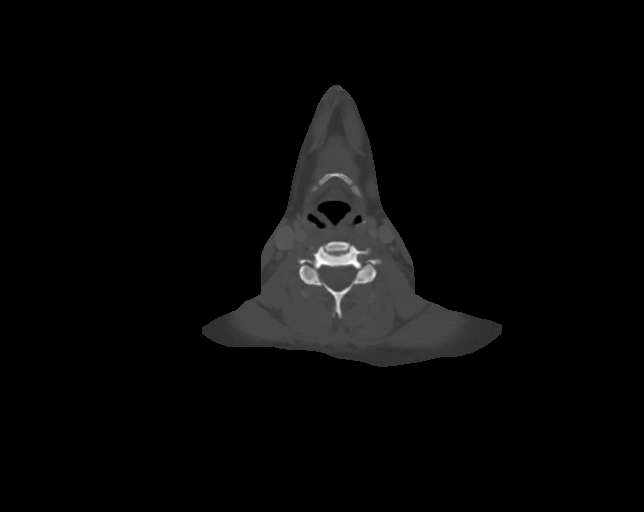
[im 99/132  bone]
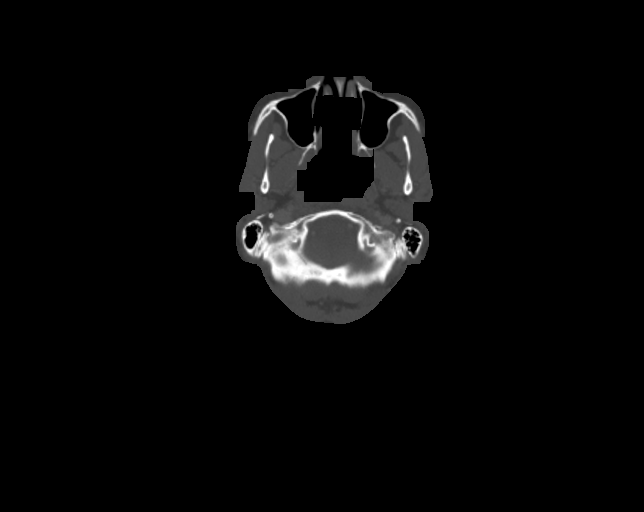

[16 of 35 positions shown; findings below may reference images not displayed]

RADIATION DOSE REDUCTION: This exam was performed according to the
departmental dose-optimization program which includes automated
exposure control, adjustment of the mA and/or kV according to
patient size and/or use of iterative reconstruction technique.

CONTRAST:  75mL OMNIPAQUE IOHEXOL 300 MG/ML  SOLN
FINDINGS: Pharynx and larynx: Normal. No mass or swelling. Negative for
peritonsillar abscess.

Salivary glands: No inflammation, mass, or stone. Hypoplastic right
submandibular gland

Thyroid: Negative

Lymph nodes: Small bilateral cervical lymph nodes. Largest lymph
node is a left level 2 lymph node 11 mm short axis dimension. No
nodal necrosis.

Vascular: Normal vascular enhancement.

Limited intracranial: Increased CSF anterior to the right temporal
lobe likely due to arachnoid cyst in the middle cranial fossa.

Visualized orbits: Negative

Mastoids and visualized paranasal sinuses: Mild mucosal edema
paranasal sinuses. No air-fluid level. Mastoid and middle ear clear
bilaterally.

Skeleton: Mild cervical spondylosis.  No acute skeletal abnormality.

Upper chest: Lung apices clear bilaterally.

Other: None
IMPRESSION: Negative for pharyngeal edema or airway compromise. Negative for
peritonsillar abscess

Mild cervical adenopathy likely due to pharyngitis given the
history.

## 2021-04-19 MED ORDER — IOHEXOL 300 MG/ML  SOLN
75.0000 mL | Freq: Once | INTRAMUSCULAR | Status: AC | PRN
  Administered 2021-04-19: 75 mL via INTRAVENOUS
  Filled 2021-04-19: qty 75

## 2021-04-19 MED ORDER — AMOXICILLIN-POT CLAVULANATE 875-125 MG PO TABS: 1.0000 | ORAL_TABLET | Freq: Two times a day (BID) | ORAL | 0 refills | Status: AC

## 2021-04-19 NOTE — Discharge Instructions (Signed)
Take Augmentin twice daily for ten days.  

## 2021-04-19 NOTE — ED Provider Notes (Signed)
[facility] Provider Note  Patient Contact: 3:53 PM (approximate)   History   neck swelling   HPI  Christina Marquez is a 36 y.o. female presents to the emergency department with concern for neck swelling for the past 3 weeks.  Patient reports that she feels a fullness under her chin.  She denies difficulty swallowing or pharyngitis.  No shortness of breath or pain underneath the tongue.  Denies a history of thyroid issues in the past.  No fever or chills at home.  No chest pain or abdominal pain.      Physical Exam   Triage Vital Signs: ED Triage Vitals  Enc Vitals Group     BP 04/19/21 1501 (!) 151/113     Pulse Rate 04/19/21 1501 88     Resp 04/19/21 1501 18     Temp 04/19/21 1501 98 F (36.7 C)     Temp src --      SpO2 04/19/21 1501 94 %     Weight --      Height --      Head Circumference --      Peak Flow --      Pain Score 04/19/21 1459 0     Pain Loc --      Pain Edu? --      Excl. in [place]? --     Most recent vital signs: Vitals:   04/19/21 1501  BP: (!) 151/113  Pulse: 88  Resp: 18  Temp: 98 F (36.7 C)  SpO2: 94%     General: Alert and in no acute distress. Eyes:  PERRL. EOMI. Head: No acute traumatic findings ENT:      Ears:       Nose: No congestion/rhinnorhea.      Mouth/Throat: Mucous membranes are moist. Neck: No stridor. No cervical spine tenderness to palpation. Hematological/Lymphatic/Immunilogical: No cervical lymphadenopathy. Cardiovascular:  Good peripheral perfusion Respiratory: Normal respiratory effort without tachypnea or retractions. Lungs CTAB. Good air entry to the bases with no decreased or absent breath sounds. Gastrointestinal: Bowel sounds 4 quadrants. Soft and nontender to palpation. No guarding or rigidity. No palpable masses. No distention. No CVA tenderness. Musculoskeletal: Full range of motion to all extremities.  Neurologic:  No gross focal neurologic deficits are appreciated.  Skin:   No rash  noted Other:   ED Results / Procedures / Treatments   Labs (all labs ordered are listed, but only abnormal results are displayed) Labs Reviewed  CBC WITH DIFFERENTIAL/PLATELET - Abnormal; Notable for the following components:      Result Value   WBC 14.3 (*)    Neutro Abs 9.2 (*)    Monocytes Absolute 1.1 (*)    All other components within normal limits  COMPREHENSIVE METABOLIC PANEL - Abnormal; Notable for the following components:   Calcium 8.8 (*)    Total Protein 6.4 (*)    AST 12 (*)    All other components within normal limits  TSH  T4, FREE       RADIOLOGY  I personally viewed and evaluated these images as part of my medical decision making, as well as reviewing the written report by the radiologist.  ED Provider Interpretation: I personally reviewed CT soft tissue neck.  Cervical adenopathy but no other acute abnormality was visualized.   PROCEDURES:  Critical Care performed: No  Procedures   MEDICATIONS ORDERED IN ED: Medications  iohexol (OMNIPAQUE) 300 MG/ML solution 75 mL (75 mLs Intravenous Contrast Given 04/19/21 1736)  IMPRESSION / MDM / ASSESSMENT AND PLAN / ED COURSE  I reviewed the triage vital signs and the nursing notes.                              Differential diagnosis includes, but is not limited to, neck swelling, thyroiditis, Ludwick's, Lemierre's,  Assessment and Plan:  Neck pain 36 year old female presents to the emergency department with anterior neck pressure and concern for possible neck swelling.  Patient was hypertensive at triage but vital signs were otherwise reassuring.  Patient was speaking in complete sentences and managing her own secretions.  Patient had mildly elevated white blood cell count at 14.3 with left shift.  CMP was reassuring.  T4 and TSH within range.  CT soft tissue neck was obtained which shows cervical adenopathy but no other concerning findings.  I personally reviewed CT soft tissue  neck.  Patient was discharged with Augmentin.  Recommended Tylenol and ibuprofen alternating for discomfort.  Return precautions were given to return with new or worsening symptoms.   FINAL CLINICAL IMPRESSION(S) / ED DIAGNOSES   Final diagnoses:  Neck pain     Rx / DC Orders   ED Discharge Orders          Ordered    amoxicillin-clavulanate (AUGMENTIN) 875-125 MG tablet  2 times daily        04/19/21 1811             Note:  This document was prepared using Dragon voice recognition software and may include unintentional dictation errors.   Vallarie Mare Samson, Hershal Coria 04/19/21 Mariam Dollar, MD 04/19/21 1919

## 2021-04-19 NOTE — ED Notes (Signed)
Pt verbalizes understanding of d/c instructions, medications and follow up 

## 2021-04-19 NOTE — ED Notes (Signed)
Pt  states swelling to neck and chin area. No redness noted. Pt denies any trouble swallowing.

## 2021-04-19 NOTE — ED Triage Notes (Addendum)
Pt come with c/o neck swelling for about 3 weeks now. Pt states no pain when swallowing, sore throat, fever or chills.  Pt states it just feels like some pressure under her chin.  Pt states rash to bilateral hands that she noticed on Friday. Pt states she does wear gloves a lot at work.

## 2021-05-12 ENCOUNTER — Other Ambulatory Visit: Payer: Self-pay

## 2021-05-12 ENCOUNTER — Ambulatory Visit
Admission: EM | Admit: 2021-05-12 | Discharge: 2021-05-12 | Disposition: A | Discharge status: Home / Self Care | Payer: Medicaid Other | Attending: Emergency Medicine | Admitting: Emergency Medicine

## 2021-05-12 ENCOUNTER — Ambulatory Visit (INDEPENDENT_AMBULATORY_CARE_PROVIDER_SITE_OTHER): Payer: Medicaid Other

## 2021-05-12 DIAGNOSIS — J209 Acute bronchitis, unspecified: Secondary | ICD-10-CM

## 2021-05-12 DIAGNOSIS — R059 Cough, unspecified: Secondary | ICD-10-CM

## 2021-05-12 IMAGING — CR DG CHEST 2V
2 series · 2 of 2 positions shown · non-contrast
Comparison: [DATE].

CLINICAL DATA: Cough.

EXAM:
CHEST - 2 VIEW

[chest pa]
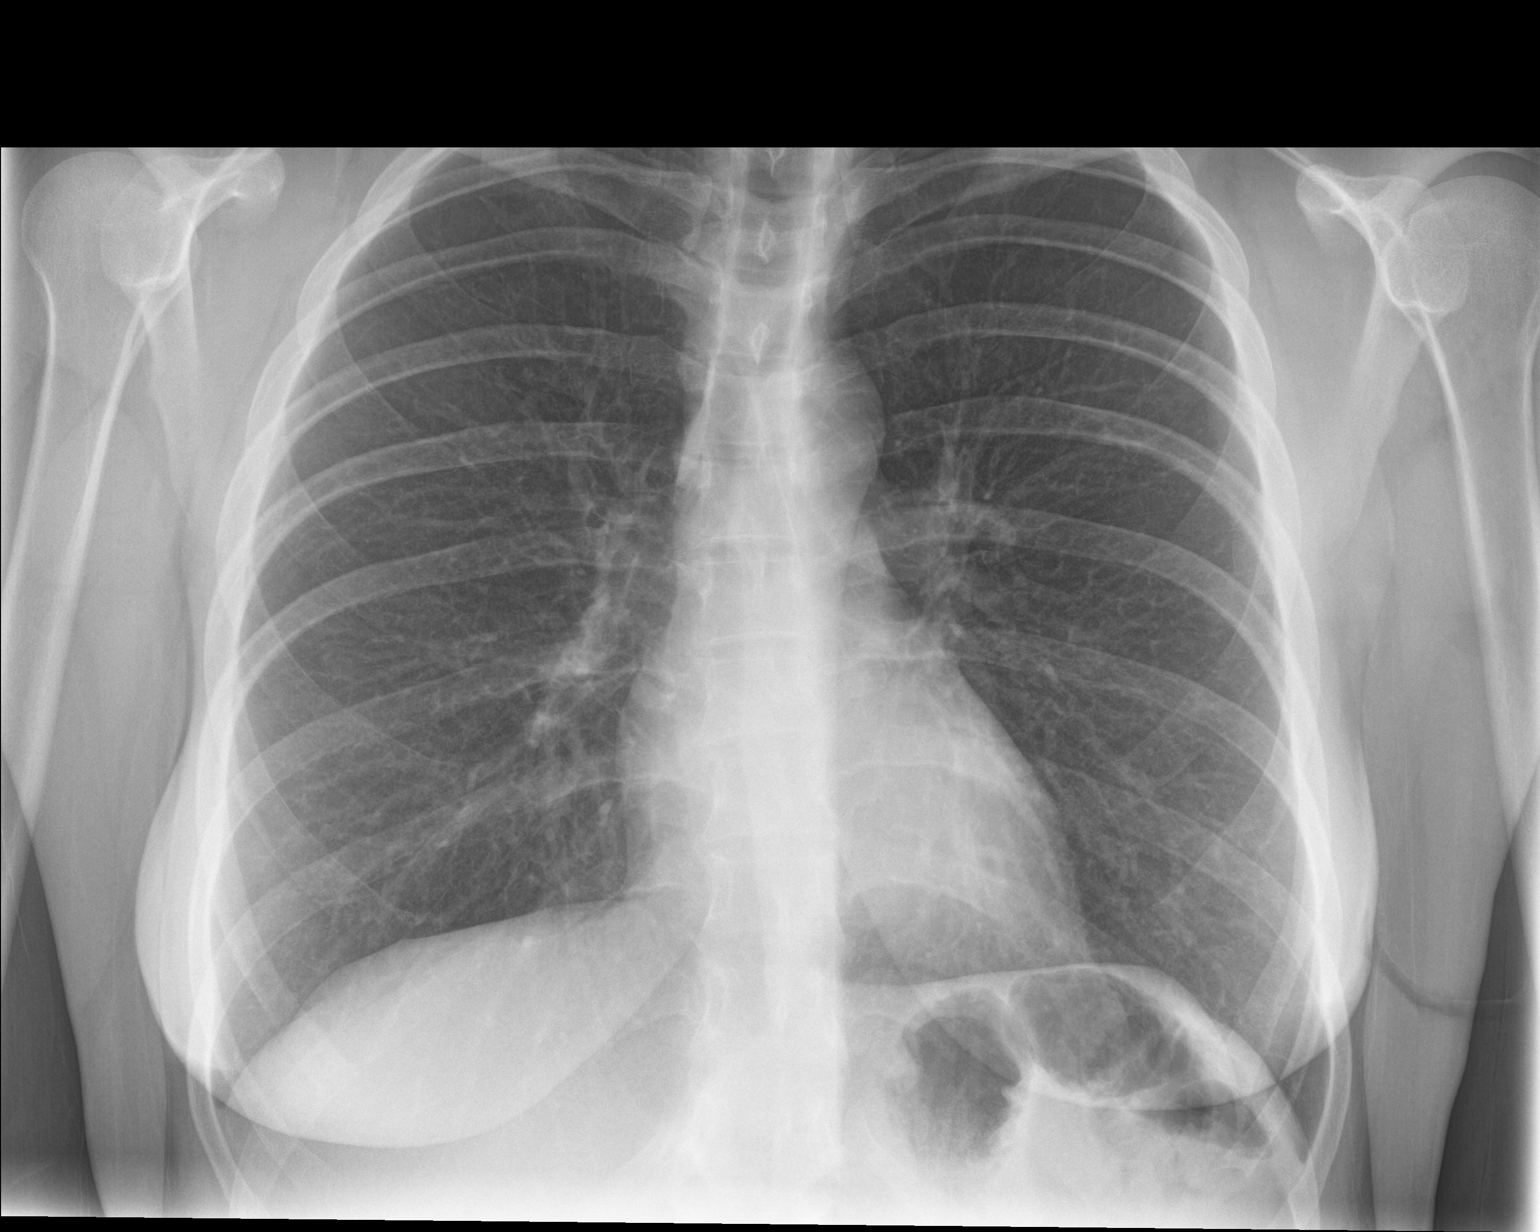

[chest lat]
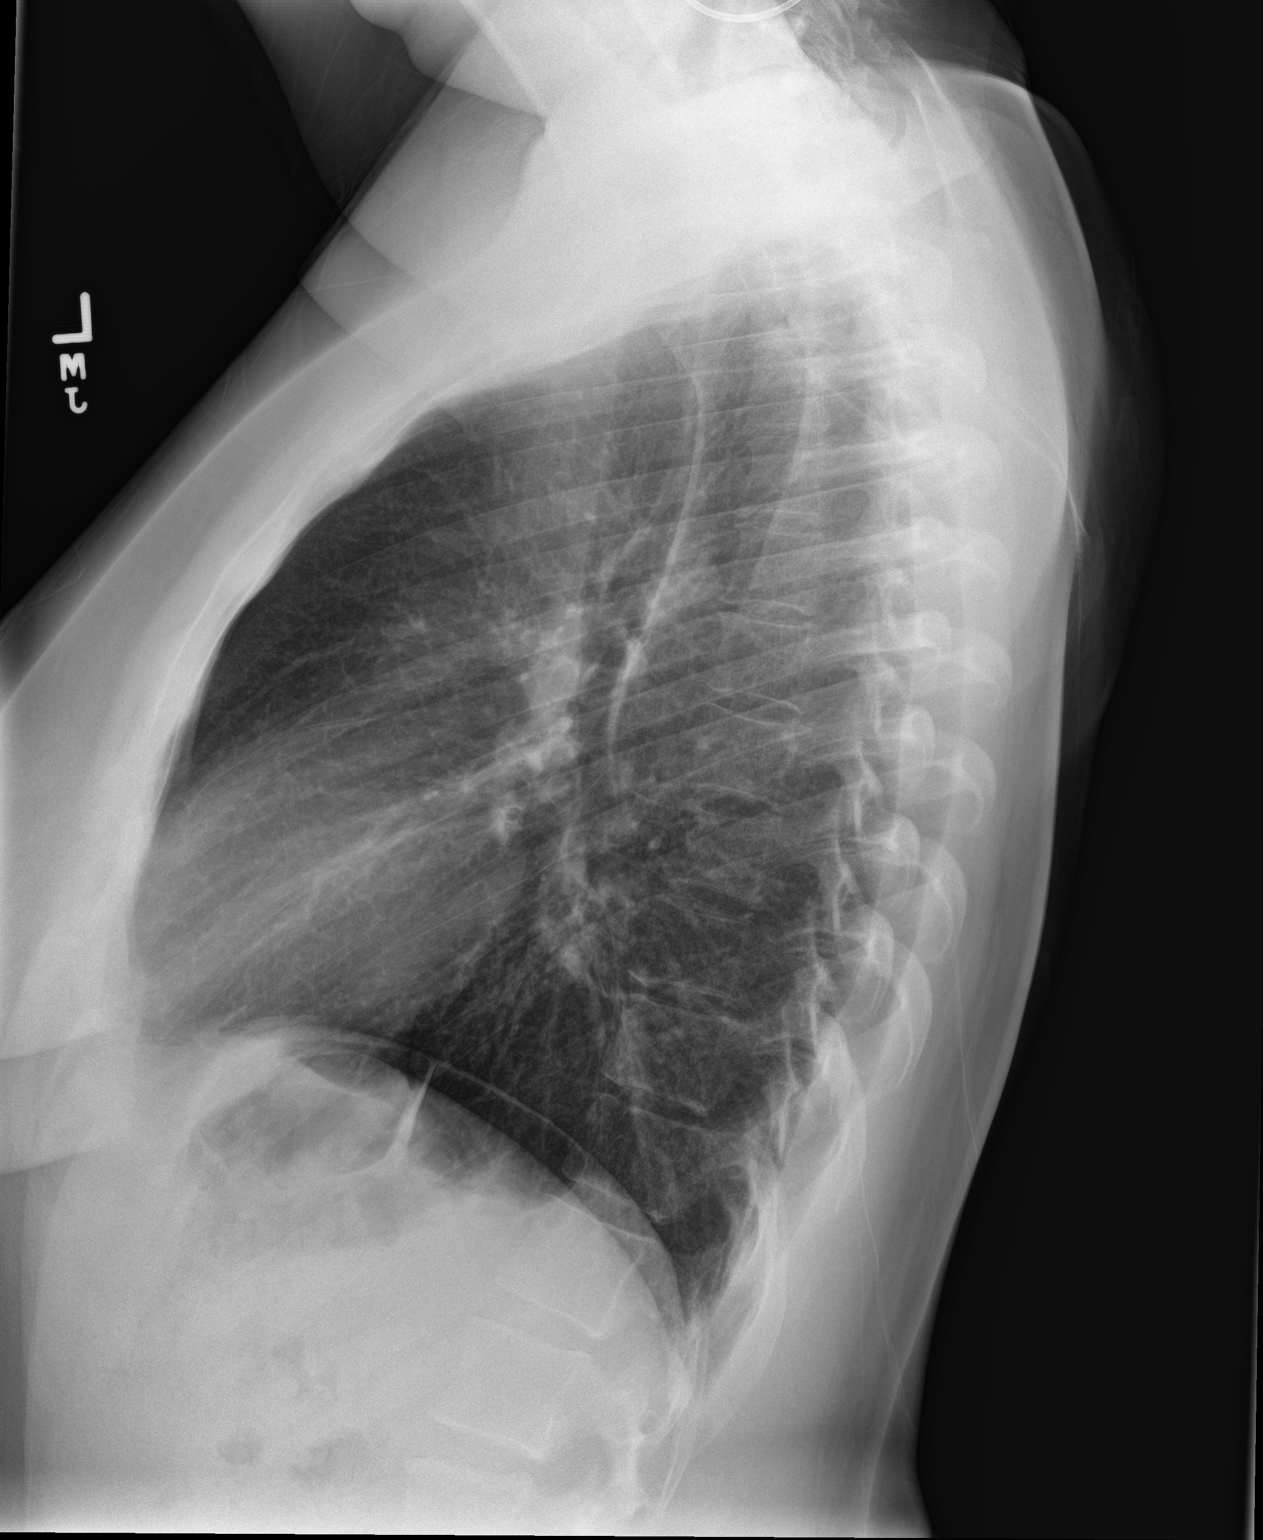

[2 of 2 positions shown; findings below may reference images not displayed]

FINDINGS: The heart size and mediastinal contours are within normal limits.
Both lungs are clear. The visualized skeletal structures are
unremarkable.
IMPRESSION: No active cardiopulmonary disease.

## 2021-05-12 MED ORDER — PREDNISONE 20 MG PO TABS: 40.0000 mg | ORAL_TABLET | Freq: Every day | ORAL | 0 refills | Status: AC

## 2021-05-12 MED ORDER — AEROCHAMBER PLUS MISC: 2 refills | Status: DC

## 2021-05-12 MED ORDER — ALBUTEROL SULFATE HFA 108 (90 BASE) MCG/ACT IN AERS: 1.0000 | INHALATION_SPRAY | RESPIRATORY_TRACT | 0 refills | Status: DC | PRN

## 2021-05-12 MED ORDER — FLUTICASONE PROPIONATE 50 MCG/ACT NA SUSP: 2.0000 | Freq: Every day | NASAL | 0 refills | Status: DC

## 2021-05-12 MED ORDER — PROMETHAZINE-DM 6.25-15 MG/5ML PO SYRP: 5.0000 mL | ORAL_SOLUTION | Freq: Four times a day (QID) | ORAL | 0 refills | Status: DC | PRN

## 2021-05-12 NOTE — ED Triage Notes (Signed)
Patient presents to Urgent Care with complaints of cough, left sided ear pressure,  and chest congestion x 1 week. Last known fever 7 days ago. Treating symptoms with Nyquil last dose on Sunday.   Denies fever

## 2021-05-12 NOTE — Discharge Instructions (Addendum)
Your chest x-ray was negative for pneumonia.  I am treating you as a bronchitis with bronchodilators, Mucinex D, Flonase, saline nasal irrigation, and steroids.  Finish steroids, even if you feel better.  2 puffs from your albuterol inhaler using your spacer every 4 hours for 2 days, then every 6 hours for 2 days, then as needed.  You can back off on the albuterol if you start to feel better.  Promethazine DM for cough.  Here is a list of primary care providers who are taking new patients:  [facility] [place] Dr. Joseph Berkshire (also specializes in sports medicine) Dr. Elizabeth Sauer [address] [place] [address] [phone]  [facility] [address] [place], [place] [address] [phone]  [facility] [place] [address]  [place] [address]  [phone]  [facility] [address] [place], [place] [address] [phone]  [facility] [address] [place]  [phone] [place], [place] [address]  Here are clinics/ other resources who will see you if you do not have insurance. Some have certain criteria that you must meet. Call them and find out what they are:  [facility]: [address], [place], [place] [address] Phone: [phone] Hours: First and Third Saturdays of each Month, 9 a.m. - 1 p.m.  [facility]: [address], [address] [place], [place] [address] Phone: [phone] Hours: Tuesday, 4 p.m. - 8 p.m. Thursday, 1 p.m. - 8 p.m. Wednesday, 9 a.m. - [facility] [address], [place], [place] [address] Phone: [phone] Pharmacy Phone Number: [phone] Dental Phone Number: [phone] [facility] Insurance Help: [phone]  Dental Hours: Monday - Thursday, 8 a.m. - 6 p.m.  [facility] [address] [place], [place] [address] Phone: [phone] Pharmacy Phone Number: [phone] Insurance Help: [phone]  [facility] [address] [place] [place], [place] [address] Phone: [phone] Pharmacy Phone Number: [phone] [facility] Insurance Help: [phone]  [facility] [address] [place], [place] [address] Phone: [phone] [facility] Insurance Help: [phone]   [facility] [address], [place], [place] [address] Phone: [phone]  Go to www.goodrx.com  or www.costplusdrugs.com to look up your medications. This will give you a list of where you can find your prescriptions at the most affordable prices. Or ask the pharmacist what the cash price is, or if they have any other discount programs available to help make your medication more affordable. This can be less expensive than what you would pay with insurance.

## 2021-05-12 NOTE — ED Provider Notes (Signed)
HPI  SUBJECTIVE:  Christina Marquez is a 36 y.o. female who presents with 1 week of URI symptoms that has now settled into her chest.  She reports a cough productive of yellowish sputum, chest congestion.  She reports continued nasal congestion, rhinorrhea that has become clear.  She states that this is getting better.  She reports left neck pain, postnasal drip and raw, sore throat secondary to the cough.  She had fevers Tmax 101 1 week ago, but has been afebrile for the past 6 days.  No ear pain, sinus pain or pressure, facial swelling, upper dental pain, wheezing, chest pain, shortness of breath.  She was treated with 10 days of Augmentin 3 weeks ago for lymphadenopathy, she finished it on 2/1.  No antipyretic in the past 6 hours.  She is unable to sleep at night secondary to the cough.  She has been taking NyQuil and Mucinex without improvement in her symptoms.  Symptoms are worse at night.  She has a past medical history of smoking for the past 15 years.  Is in the process of quitting.  No history of diabetes, hypertension, pulmonary disease, chronic kidney disease.  LMP: 2/9.  Denies the possibility of being pregnant.  PMD: None.   History reviewed. No pertinent past medical history.  Past Surgical History:  Procedure Laterality Date   CESAREAN SECTION  2003   LEEP      Family History  Problem Relation Age of Onset   Diabetes Mother    Emphysema Mother    Allergies Mother    Asthma Mother    Hypertension Mother    Depression Mother    Seizures Father    Emphysema Father    Depression Father    Lung cancer Maternal Grandmother    Heart attack Maternal Grandmother    Leukemia Maternal Grandfather    Breast cancer Maternal Aunt    Heart attack Maternal Aunt    Post-traumatic stress disorder Brother    Anxiety disorder Brother    Asthma Son    Asthma Daughter     Social History   Tobacco Use   Smoking status: Every Day    Packs/day: 0.50    Types: Cigarettes   Smokeless  tobacco: Never  Vaping Use   Vaping Use: Never used  Substance Use Topics   Alcohol use: Yes    Alcohol/week: 26.0 standard drinks    Types: 24 Cans of beer, 2 Shots of liquor per week    Comment: weekends    Drug use: Yes    Frequency: 7.0 times per week    Types: Marijuana    No current facility-administered medications for this encounter.  Current Outpatient Medications:    albuterol (VENTOLIN HFA) 108 (90 Base) MCG/ACT inhaler, Inhale 1-2 puffs into the lungs every 4 (four) hours as needed for wheezing or shortness of breath., Disp: 1 each, Rfl: 0   fluticasone (FLONASE) 50 MCG/ACT nasal spray, Place 2 sprays into both nostrils daily., Disp: 16 g, Rfl: 0   predniSONE (DELTASONE) 20 MG tablet, Take 2 tablets (40 mg total) by mouth daily with breakfast for 5 days., Disp: 10 tablet, Rfl: 0   promethazine-dextromethorphan (PROMETHAZINE-DM) 6.25-15 MG/5ML syrup, Take 5 mLs by mouth 4 (four) times daily as needed for cough., Disp: 118 mL, Rfl: 0   Spacer/Aero-Holding Chambers (AEROCHAMBER PLUS) inhaler, Use with inhaler, Disp: 1 each, Rfl: 2  No Known Allergies   ROS  As noted in HPI.   Physical Exam  BP 123/86 (  BP Location: Right Arm)    Pulse 74    Temp 97.8 F (36.6 C) (Oral)    Resp 16    LMP 05/07/2021    SpO2 96%   Constitutional: Well developed, well nourished, no acute distress Eyes:  EOMI, conjunctiva normal bilaterally HENT: Normocephalic, atraumatic,mucus membranes moist.  Positive nasal congestion.  Erythematous, swollen turbinates.  No maxillary, frontal sinus tenderness.  No obvious postnasal drip. Neck: No appreciable cervical lymphadenopathy Respiratory: Normal inspiratory effort, lungs clear bilaterally.  No anterior, lateral chest wall tenderness  cardiovascular: Normal rate, regular rhythm, no murmurs, rubs, gallops. GI: nondistended skin: No rash, skin intact Musculoskeletal: no deformities Neurologic: Alert & oriented x 3, no focal neuro  deficits Psychiatric: Speech and behavior appropriate   ED Course   Medications - No data to display  Orders Placed This Encounter  Procedures   DG Chest 2 View    Standing Status:   Standing    Number of Occurrences:   1    Order Specific Question:   Reason for Exam (SYMPTOM  OR DIAGNOSIS REQUIRED)    Answer:   cough x 1 week r/o PNA    No results found for this or any previous visit (from the past 24 hour(s)). DG Chest 2 View  Result Date: 05/12/2021 CLINICAL DATA:  Cough. EXAM: CHEST - 2 VIEW COMPARISON:  September 10, 2020. FINDINGS: The heart size and mediastinal contours are within normal limits. Both lungs are clear. The visualized skeletal structures are unremarkable. IMPRESSION: No active cardiopulmonary disease. Electronically Signed   By: Lupita Raider M.D.   On: 05/12/2021 15:51    ED Clinical Impression  1. Acute bronchitis, unspecified organism      ED Assessment/Plan  Checking chest x-ray to rule out pneumonia  Reviewed imaging independently.  No pneumonia.  See radiology report for full details.  Chest x-ray negative for pneumonia.  Will treat as a bronchitis with prednisone, regularly scheduled albuterol inhaler with a spacer for the next 4 days, promethazine DM, Mucinex D, Flonase, saline nasal irrigation.  Work note for today.  Follow-up with PMD of choice or may return here if not getting better in 5 days, or sooner if she gets worse, we can consider antibiotics at that time.  Discussed  imaging, MDM, treatment plan, and plan for follow-up with patient. Discussed sn/sx that should prompt return to the ED. patient agrees with plan.   Meds ordered this encounter  Medications   albuterol (VENTOLIN HFA) 108 (90 Base) MCG/ACT inhaler    Sig: Inhale 1-2 puffs into the lungs every 4 (four) hours as needed for wheezing or shortness of breath.    Dispense:  1 each    Refill:  0   fluticasone (FLONASE) 50 MCG/ACT nasal spray    Sig: Place 2 sprays into both  nostrils daily.    Dispense:  16 g    Refill:  0   predniSONE (DELTASONE) 20 MG tablet    Sig: Take 2 tablets (40 mg total) by mouth daily with breakfast for 5 days.    Dispense:  10 tablet    Refill:  0   promethazine-dextromethorphan (PROMETHAZINE-DM) 6.25-15 MG/5ML syrup    Sig: Take 5 mLs by mouth 4 (four) times daily as needed for cough.    Dispense:  118 mL    Refill:  0   Spacer/Aero-Holding Chambers (AEROCHAMBER PLUS) inhaler    Sig: Use with inhaler    Dispense:  1 each    Refill:  2    Please educate patient on use      *This clinic note was created using Dragon dictation software. Therefore, there may be occasional mistakes despite careful proofreading.  ?    Domenick Gong, MD 05/12/21 1622

## 2021-06-05 ENCOUNTER — Encounter: Payer: Self-pay | Admitting: Family Medicine

## 2021-06-05 ENCOUNTER — Other Ambulatory Visit: Payer: Self-pay

## 2021-06-05 ENCOUNTER — Ambulatory Visit: Payer: Medicaid Other | Admitting: Family Medicine

## 2021-06-05 DIAGNOSIS — Z3200 Encounter for pregnancy test, result unknown: Secondary | ICD-10-CM

## 2021-06-05 DIAGNOSIS — Z113 Encounter for screening for infections with a predominantly sexual mode of transmission: Secondary | ICD-10-CM | POA: Diagnosis not present

## 2021-06-05 LAB — HM HIV SCREENING LAB: HM HIV Screening: NEGATIVE

## 2021-06-05 LAB — WET PREP FOR TRICH, YEAST, CLUE
Trichomonas Exam: NEGATIVE
Yeast Exam: NEGATIVE

## 2021-06-05 LAB — PREGNANCY, URINE: Preg Test, Ur: NEGATIVE

## 2021-06-05 NOTE — Progress Notes (Signed)
? ?[facility] problem visit  [facility] ? ?Subjective:  ?Christina Marquez is a 36 y.o. being seen today for  ? ?Chief Complaint  ?Patient presents with  ? SEXUALLY TRANSMITTED DISEASE  ?  screening  ? ? ?HPI ? ? ?Does the patient have a current or past history of drug use? Yes  ? No components found for: HCV] ? ? ?Health Maintenance Due  ?Topic Date Due  ? COVID-19 Vaccine (1) Never done  ? TETANUS/TDAP  Never done  ? PAP SMEAR-Modifier  06/09/2017  ? INFLUENZA VACCINE  Never done  ? ? ?ROS ? ?The following portions of the patient's history were reviewed and updated as appropriate: allergies, current medications, past family history, past medical history, past social history, past surgical history and problem list. Problem list updated. ? ? ?See flowsheet for other program required questions. ? ?Objective:  ?There were no vitals filed for this visit. ? ?Physical Exam ?Vitals and nursing note reviewed.  ?Constitutional:   ?   Appearance: Normal appearance.  ?HENT:  ?   Head: Normocephalic and atraumatic.  ?   Mouth/Throat:  ?   Mouth: Mucous membranes are moist.  ?   Pharynx: Oropharynx is clear. No oropharyngeal exudate or posterior oropharyngeal erythema.  ?Pulmonary:  ?   Effort: Pulmonary effort is normal.  ?Abdominal:  ?   General: Abdomen is flat.  ?   Palpations: There is no mass.  ?   Tenderness: There is no abdominal tenderness. There is no rebound.  ?Genitourinary: ?   General: Normal vulva.  ?   Exam position: Lithotomy position.  ?   Pubic Area: No rash or pubic lice.   ?   Labia:     ?   Right: No rash or lesion.     ?   Left: No rash or lesion.   ?   Vagina: Normal. No vaginal discharge, erythema, bleeding or lesions.  ?   Cervix: No cervical motion tenderness, discharge, friability, lesion or erythema.  ?   Uterus: Normal.   ?   Adnexa: Right adnexa normal and left adnexa normal.  ?   Rectum: Normal.  ?   Comments: External genitalia without, lice, nits, erythema,  edema , lesions or inguinal adenopathy. Vagina with normal mucosa and discharge and pH equals 4.  Cervix without visual lesions, uterus firm, mobile, non-tender, no masses, CMT adnexal fullness or tenderness.   ?Lymphadenopathy:  ?   Head:  ?   Right side of head: No preauricular or posterior auricular adenopathy.  ?   Left side of head: No preauricular or posterior auricular adenopathy.  ?   Cervical: No cervical adenopathy.  ?   Upper Body:  ?   Right upper body: No supraclavicular or axillary adenopathy.  ?   Left upper body: No supraclavicular or axillary adenopathy.  ?   Lower Body: No right inguinal adenopathy. No left inguinal adenopathy.  ?Skin: ?   General: Skin is warm and dry.  ?   Findings: No rash.  ?Neurological:  ?   Mental Status: She is alert and oriented to person, place, and time.  ?Psychiatric:     ?   Mood and Affect: Mood normal.     ?   Behavior: Behavior normal.  ? ? ? ? ?Assessment and Plan:  ?Christina Marquez is a 36 y.o. female [facility] for a Women's Health problem visit ? ?1. Screening examination for venereal  disease ?Patient accepted all screenings including wet prep, oral, vaginal CT/GC and bloodwork for HIV/RPR.  ?Patient meets criteria for HepB screening? Yes. Ordered? No - declined  ?Patient meets criteria for HepC screening? Yes. Ordered? No - declined  ? ?Wet prep results neg    ?Treatment needed  ?Discussed time line for [facility] results and that patient will be called with positive results and encouraged patient to call if she had not heard in 2 weeks.  ?Counseled to return or seek care for continued or worsening symptoms ?Recommended condom use with all sex ? ?Patient is currently using  no BCM   to prevent pregnancy.   ?- Chlamydia/Gonorrhea [place] [facility] ?- HIV [place] [facility] ?- Syphilis Serology, [place] [facility] ?- WET PREP FOR TRICH, YEAST, CLUE ?- Chlamydia/Gonorrhea [place] [facility] ? ?2. Encounter for pregnancy test, result unknown ?Pt  concerned about possibly being pregnant.  ? ?PT is interested in having her tubes tied.  Information given.   ?- Pregnancy, urine ? ? ?No follow-ups on file. ? ?No future appointments. ? ?Wendi Snipes, FNP ? ?

## 2021-06-05 NOTE — Progress Notes (Signed)
Pt expressed interest in information to get tubes tied. ?

## 2021-09-08 ENCOUNTER — Other Ambulatory Visit: Payer: Self-pay

## 2021-09-08 ENCOUNTER — Emergency Department: Payer: Medicaid Other

## 2021-09-08 ENCOUNTER — Emergency Department
Admission: EM | Admit: 2021-09-08 | Discharge: 2021-09-08 | Disposition: A | Discharge status: Home / Self Care | Payer: Medicaid Other | Attending: Emergency Medicine | Admitting: Emergency Medicine

## 2021-09-08 DIAGNOSIS — D72829 Elevated white blood cell count, unspecified: Secondary | ICD-10-CM | POA: Diagnosis not present

## 2021-09-08 DIAGNOSIS — R2990 Unspecified symptoms and signs involving the nervous system: Secondary | ICD-10-CM

## 2021-09-08 DIAGNOSIS — M79602 Pain in left arm: Secondary | ICD-10-CM | POA: Insufficient documentation

## 2021-09-08 DIAGNOSIS — M62838 Other muscle spasm: Secondary | ICD-10-CM | POA: Diagnosis not present

## 2021-09-08 DIAGNOSIS — R202 Paresthesia of skin: Secondary | ICD-10-CM | POA: Diagnosis present

## 2021-09-08 LAB — PROTIME-INR
INR: 1 (ref 0.8–1.2)
Prothrombin Time: 13.4 seconds (ref 11.4–15.2)

## 2021-09-08 LAB — COMPREHENSIVE METABOLIC PANEL
ALT: 14 U/L (ref 0–44)
AST: 16 U/L (ref 15–41)
Albumin: 3.6 g/dL (ref 3.5–5.0)
Alkaline Phosphatase: 58 U/L (ref 38–126)
Anion gap: 3 — ABNORMAL LOW (ref 5–15)
BUN: 14 mg/dL (ref 6–20)
CO2: 24 mmol/L (ref 22–32)
Calcium: 8.7 mg/dL — ABNORMAL LOW (ref 8.9–10.3)
Chloride: 110 mmol/L (ref 98–111)
Creatinine, Ser: 0.69 mg/dL (ref 0.44–1.00)
GFR, Estimated: >60 mL/min (ref >60)
Glucose, Bld: 107 mg/dL — ABNORMAL HIGH (ref 70–99)
Potassium: 3.5 mmol/L (ref 3.5–5.1)
Sodium: 137 mmol/L (ref 135–145)
Total Bilirubin: 0.6 mg/dL (ref 0.3–1.2)
Total Protein: 6.3 g/dL — ABNORMAL LOW (ref 6.5–8.1)

## 2021-09-08 LAB — DIFFERENTIAL
Abs Immature Granulocytes: 0.05 10*3/uL (ref 0.00–0.07)
Basophils Absolute: 0.1 10*3/uL (ref 0.0–0.1)
Basophils Relative: 1 %
Eosinophils Absolute: 0.1 10*3/uL (ref 0.0–0.5)
Eosinophils Relative: 1 %
Immature Granulocytes: 0 %
Lymphocytes Relative: 23 %
Lymphs Abs: 2.8 10*3/uL (ref 0.7–4.0)
Monocytes Absolute: 0.9 10*3/uL (ref 0.1–1.0)
Monocytes Relative: 8 %
Neutro Abs: 8.3 10*3/uL — ABNORMAL HIGH (ref 1.7–7.7)
Neutrophils Relative %: 67 %

## 2021-09-08 LAB — APTT: aPTT: 27 seconds (ref 24–36)

## 2021-09-08 LAB — CBC
HCT: 43.7 % (ref 36.0–46.0)
Hemoglobin: 14.3 g/dL (ref 12.0–15.0)
MCH: 29.1 pg (ref 26.0–34.0)
MCHC: 32.7 g/dL (ref 30.0–36.0)
MCV: 88.8 fL (ref 80.0–100.0)
Platelets: 285 10*3/uL (ref 150–400)
RBC: 4.92 MIL/uL (ref 3.87–5.11)
RDW: 14.2 % (ref 11.5–15.5)
WBC: 12.3 10*3/uL — ABNORMAL HIGH (ref 4.0–10.5)
nRBC: 0 % (ref 0.0–0.2)

## 2021-09-08 LAB — TROPONIN I (HIGH SENSITIVITY)
Troponin I (High Sensitivity): 4 ng/L (ref <18)
Troponin I (High Sensitivity): 7 ng/L (ref <18)

## 2021-09-08 LAB — CBG MONITORING, ED: Glucose-Capillary: 148 mg/dL — ABNORMAL HIGH (ref 70–99)

## 2021-09-08 IMAGING — MR MR CERVICAL SPINE W/O CM
5 series · 38 of 48 positions shown · non-contrast
Comparison: No prior MRI of the cervical spine correlation is made
with CTA head and neck [DATE] and cervical radiographs
[DATE]

CLINICAL DATA: Left arm weakness left-sided numbness, blurry vision

EXAM:
MRI CERVICAL SPINE WITHOUT CONTRAST
TECHNIQUE: Multiplanar, multisequence MR imaging of the cervical spine was
performed. No intravenous contrast was administered.

[Series 5: T2 · sagittal · 3.0mm · 0.62mm/px · 8 of 15 slices shown (1 of 2)]
[im 1/15]
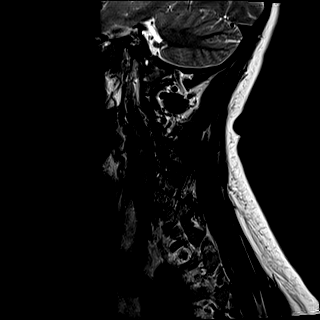
[im 3/15]
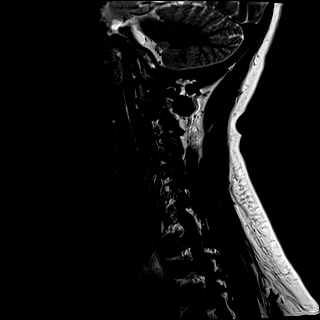
[im 5/15]
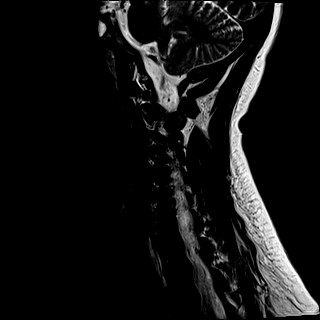
[im 7/15]
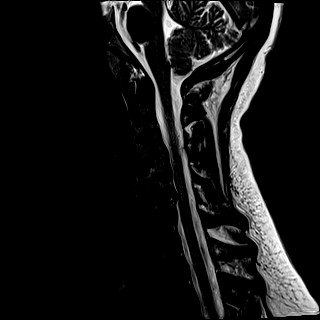
[im 9/15]
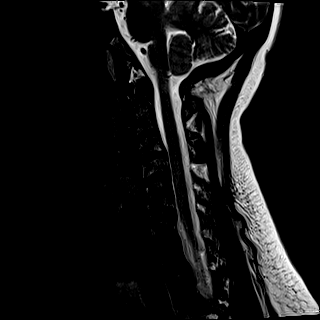
[im 11/15]
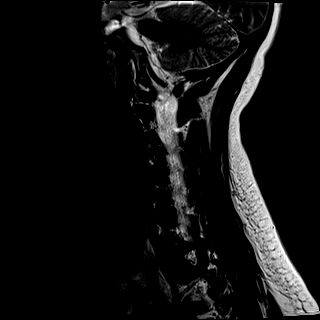
[im 13/15]
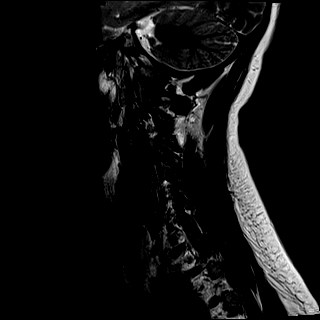
[im 15/15]
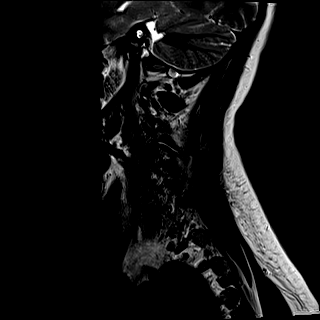

[Series 6: FLAIR · sagittal · 3.0mm · 0.78mm/px · 7 of 15 slices shown]
[im 1/15]
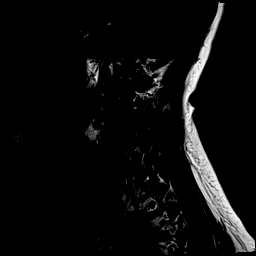
[im 3/15]
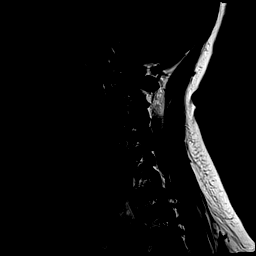
[im 5/15]
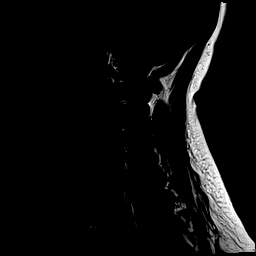
[im 8/15]
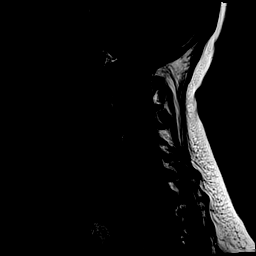
[im 10/15]
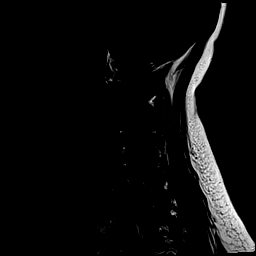
[im 12/15]
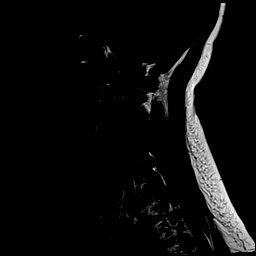
[im 15/15]
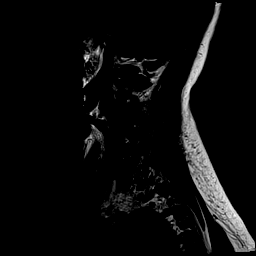

[Series 7: STIR · sagittal · 3.0mm · 0.62mm/px · 7 of 15 slices shown]
[im 1/15]
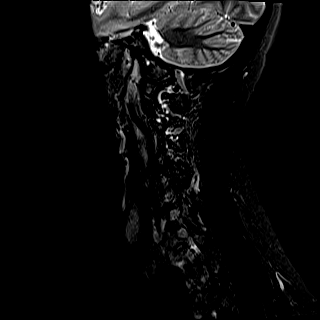
[im 3/15]
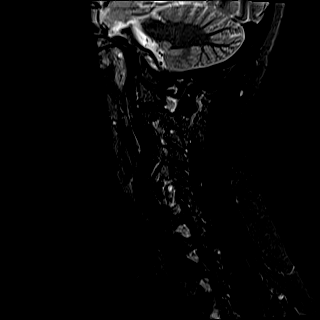
[im 5/15]
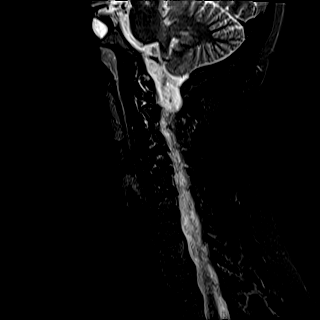
[im 8/15]
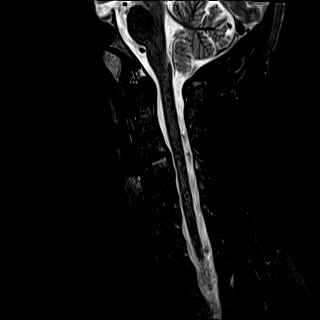
[im 10/15]
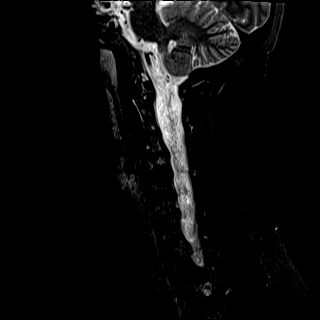
[im 12/15]
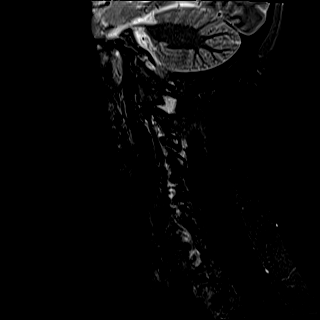
[im 15/15]
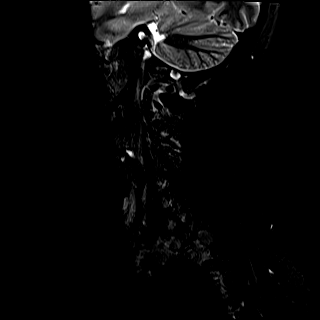

[Series 8: T2 · axial · 3.0mm · 0.70mm/px · z∈[-217,-130]mm · 9 of 26 slices shown (2 of 2)]
[im 1/26]
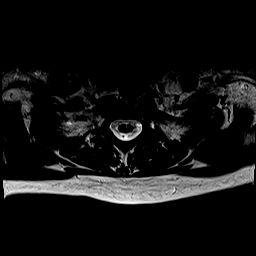
[im 5/26]
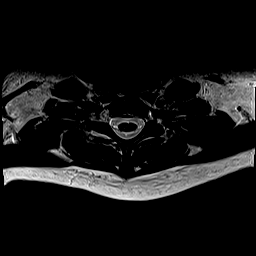
[im 9/26]
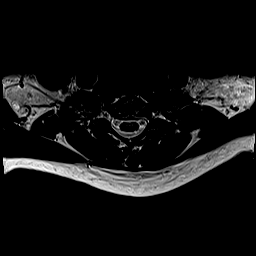
[im 11/26]
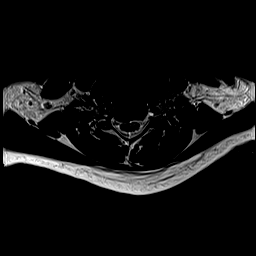
[im 13/26]
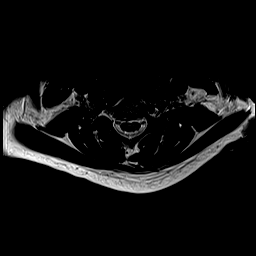
[im 15/26]
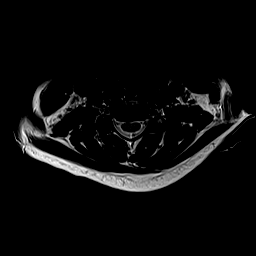
[im 17/26]
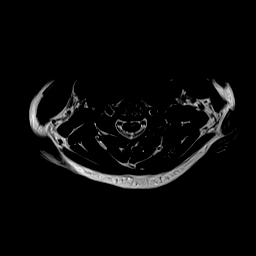
[im 21/26]
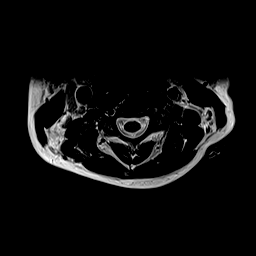
[im 26/26]
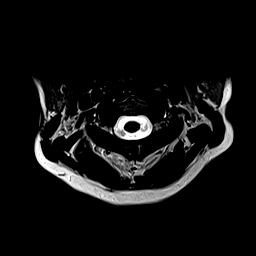

[Series 9: ax mpgr · axial · 3.0mm · 0.35mm/px · z∈[-217,-148]mm · 7 of 26 slices shown]
[im 1/26]
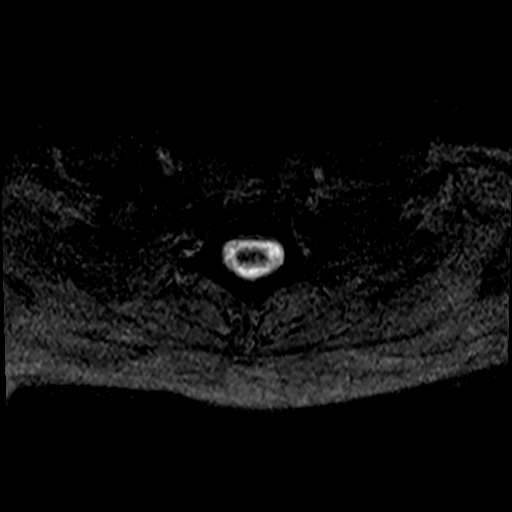
[im 5/26]
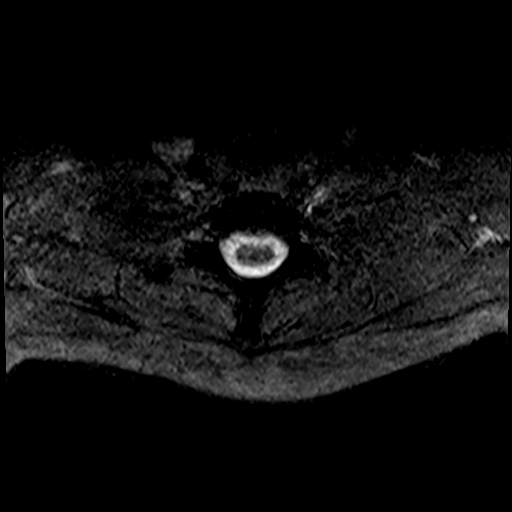
[im 9/26]
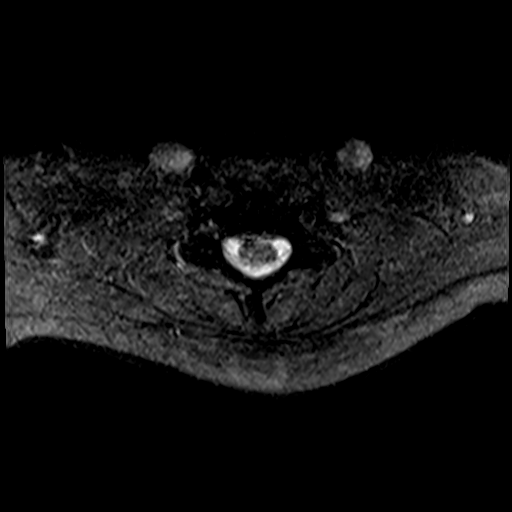
[im 11/26]
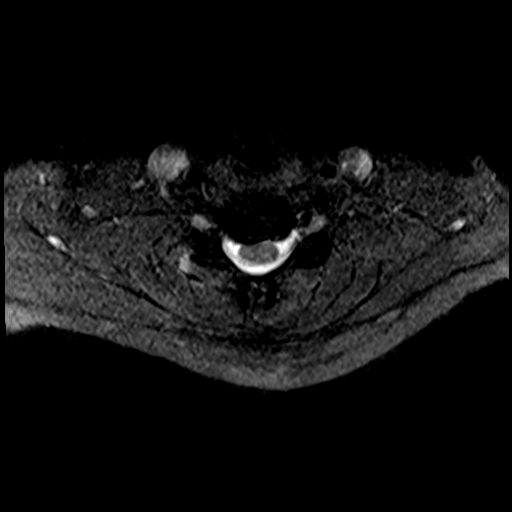
[im 15/26]
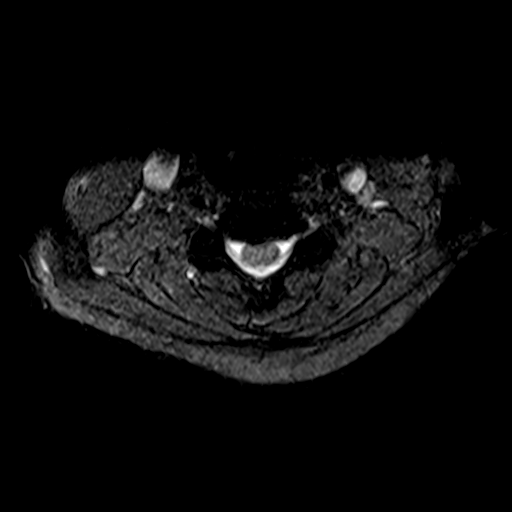
[im 17/26]
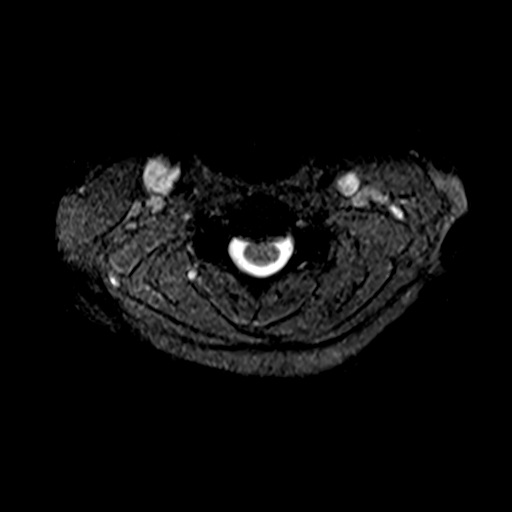
[im 21/26]
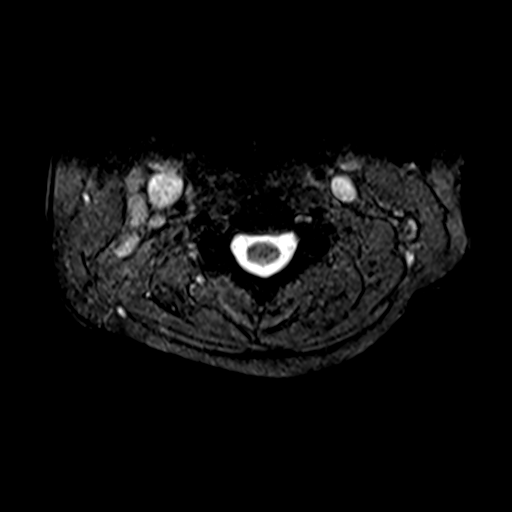

[38 of 48 positions shown; findings below may reference images not displayed]

FINDINGS: Alignment: Straightening and mild reversal of the normal cervical
lordosis. No significant listhesis.

Vertebrae: No acute fracture or suspicious osseous lesion. Endplate
degenerative changes at C3-C4.

Cord: Normal signal and morphology.

Posterior Fossa, vertebral arteries, paraspinal tissues: Negative.

Disc levels:

C2-C3: No significant disc bulge. No spinal canal stenosis or
neuroforaminal narrowing.

C3-C4: Minimal disc bulge. Left facet arthropathy. Right
uncovertebral hypertrophy. No spinal canal stenosis. Mild bilateral
neural foraminal narrowing.

C4-C5: Minimal disc bulge. Facet and uncovertebral hypertrophy. No
spinal canal stenosis. Mild bilateral neural foraminal narrowing.

C5-C6: Disc height loss without significant disc bulge.
Right-greater-than-left uncovertebral hypertrophy. Facet
arthropathy. No spinal canal stenosis or neural foraminal narrowing.

C6-C7: Disc height loss without significant disc bulge. Facet and
uncovertebral hypertrophy. No spinal canal stenosis. Moderate left
neural foraminal narrowing.

C7-T1: No significant disc bulge. No spinal canal stenosis or
neuroforaminal narrowing.
IMPRESSION: 1. C6-C7 moderate left neural foraminal narrowing. Additional mild
neural foraminal narrowing bilaterally at C3-C4 and C4-C5.
2. No spinal canal stenosis.

## 2021-09-08 IMAGING — MR MR HEAD W/O CM
12 series · 48 of 48 positions shown · non-contrast
Comparison: No prior MRI, correlation is made with CT head
[DATE]

CLINICAL DATA: Left arm weakness, blurriness left-sided numbness

EXAM:
MRI HEAD WITHOUT CONTRAST
TECHNIQUE: Multiplanar, multiecho pulse sequences of the brain and surrounding
structures were obtained without intravenous contrast.

[Series 5: ax dwi_tracew · axial · 3.0mm · 0.71mm/px · z∈[-113,+52]mm · 4 of 56 slices shown]
[im 1/56]
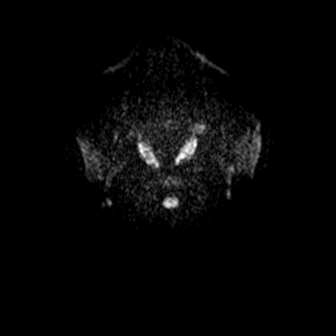
[im 19/56]
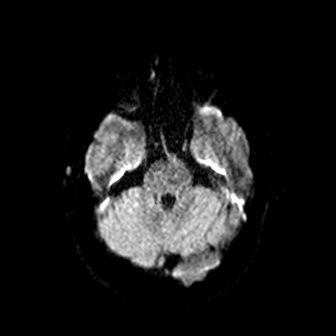
[im 37/56]
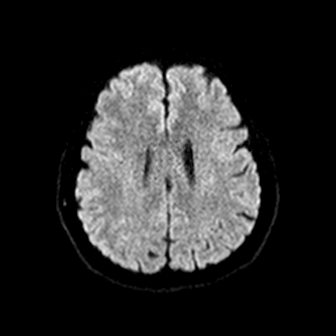
[im 56/56]
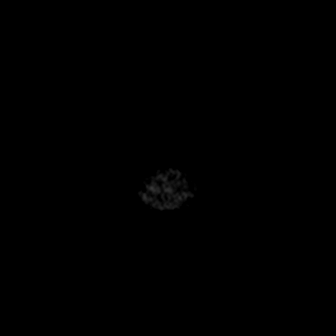

[Series 6: ax dwi_adc · axial · 3.0mm · 0.71mm/px · z∈[-113,+52]mm · 4 of 56 slices shown]
[im 1/56]
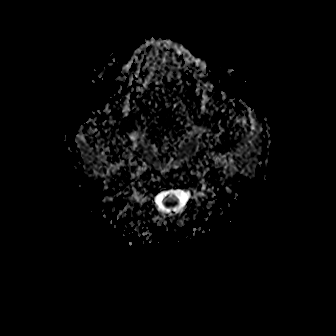
[im 19/56]
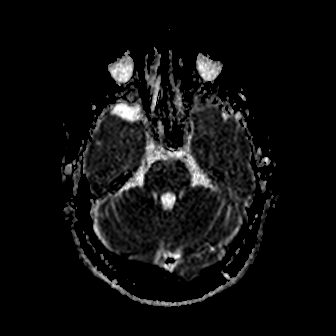
[im 37/56]
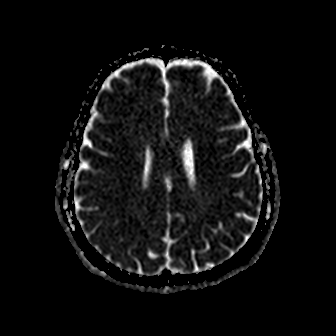
[im 56/56]
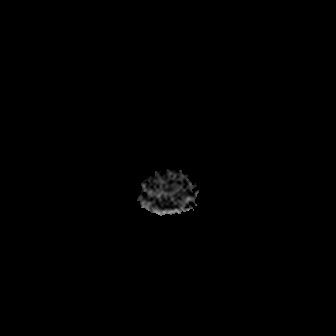

[Series 7: cor dwi_tracew · coronal · 5.0mm · 0.68mm/px · 3 of 40 slices shown]
[im 1/40]
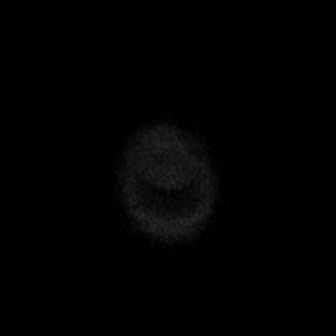
[im 20/40]
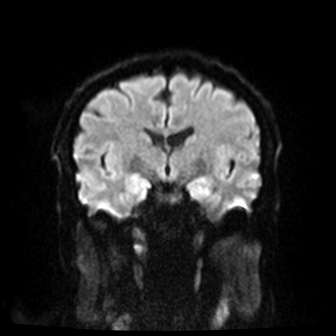
[im 40/40]
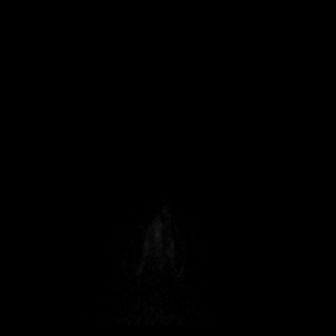

[Series 8: cor dwi_adc · coronal · 5.0mm · 0.68mm/px · 3 of 40 slices shown]
[im 1/40]
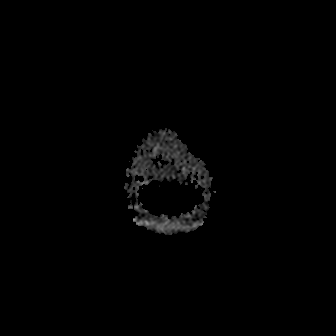
[im 20/40]
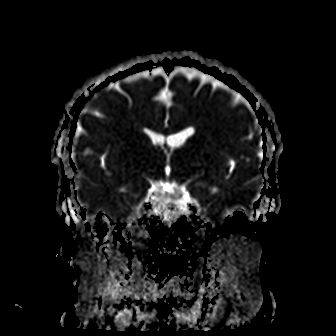
[im 40/40]
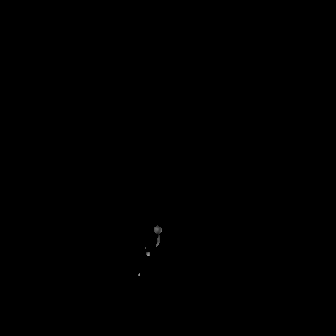

[Series 9: T1 · sagittal · 5.0mm · 0.47mm/px · 2 of 24 slices shown (1 of 2)]
[im 1/24]
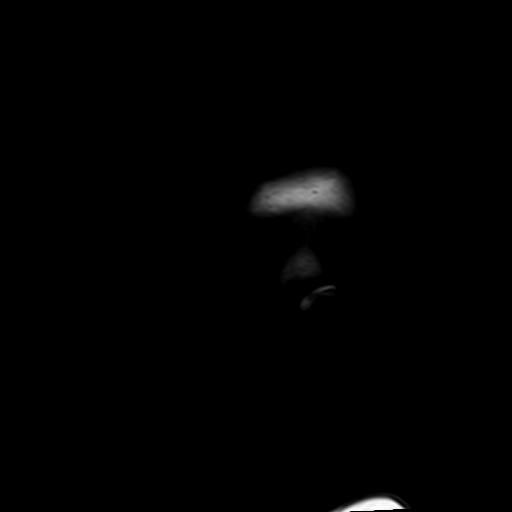
[im 24/24]
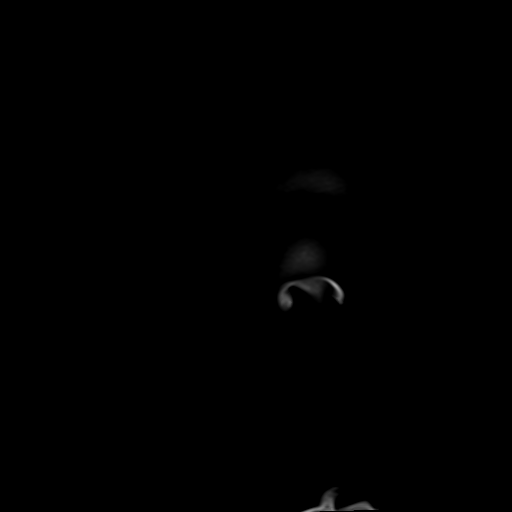

[Series 10: T2 · axial · 5.0mm · 0.86mm/px · z∈[-105,+45]mm · 2 of 26 slices shown (1 of 2)]
[im 1/26]
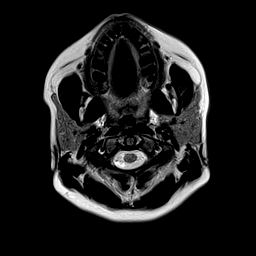
[im 26/26]
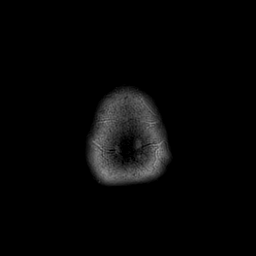

[Series 11: ax swi_mag · axial · 3.0mm · 0.90mm/px · z∈[-112,+53]mm · 4 of 56 slices shown]
[im 1/56]
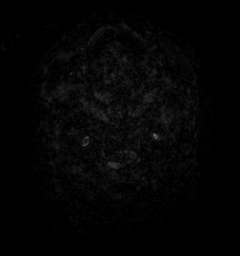
[im 19/56]
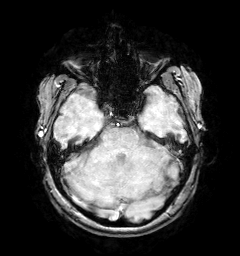
[im 37/56]
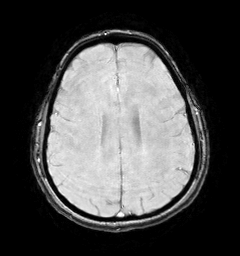
[im 56/56]
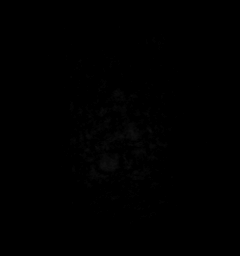

[Series 12: ax swi_pha · axial · 3.0mm · 0.90mm/px · z∈[-112,+53]mm · 4 of 56 slices shown]
[im 1/56]
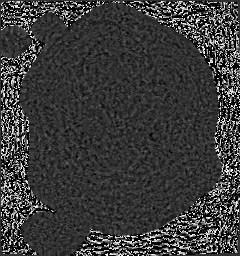
[im 19/56]
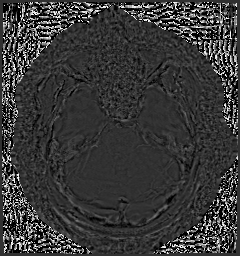
[im 37/56]
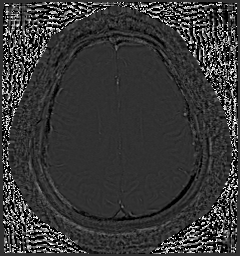
[im 56/56]
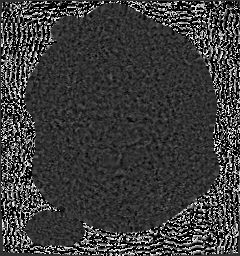

[Series 13: ax swi_swi · axial · 3.0mm · 0.90mm/px · z∈[-112,+53]mm · 4 of 56 slices shown]
[im 1/56]
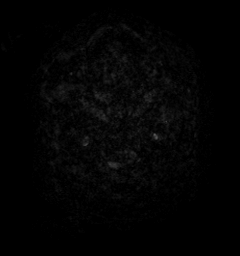
[im 19/56]
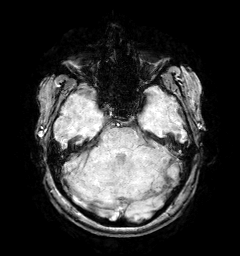
[im 37/56]
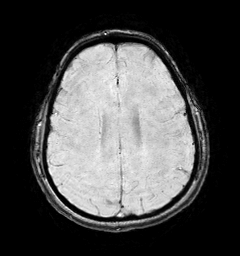
[im 56/56]
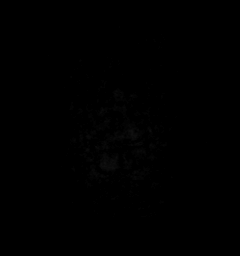

[Series 15: FLAIR · axial · 3.0mm · 0.69mm/px · z∈[-109,+52]mm · 4 of 55 slices shown]
[im 1/55]
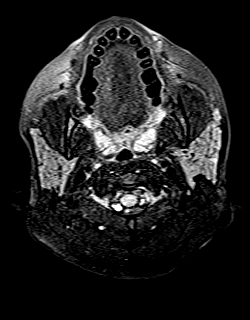
[im 19/55]
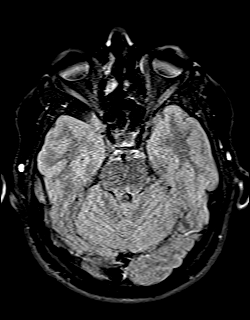
[im 37/55]
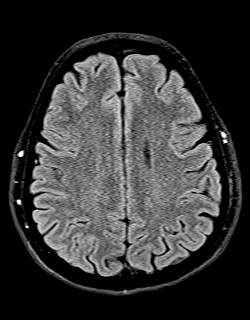
[im 55/55]
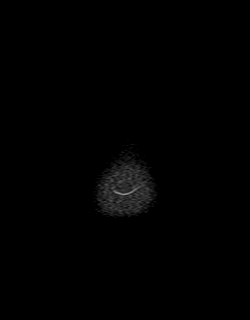

[Series 16: T1 · axial · 1.0mm · 0.98mm/px · z∈[-111,+64]mm · 12 of 173 slices shown (2 of 2)]
[im 1/173]
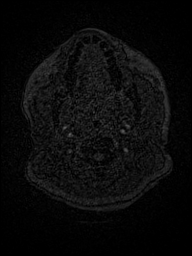
[im 16/173]
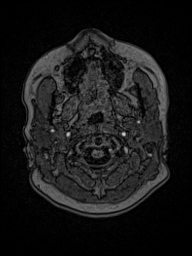
[im 32/173]
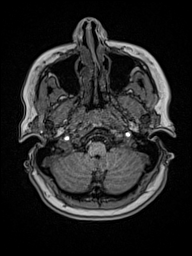
[im 47/173]
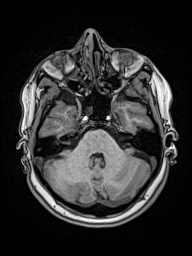
[im 63/173]
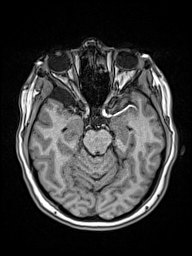
[im 79/173]
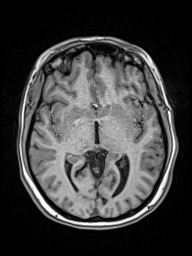
[im 94/173]
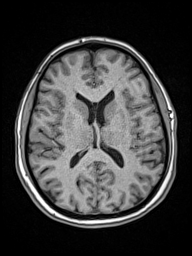
[im 110/173]
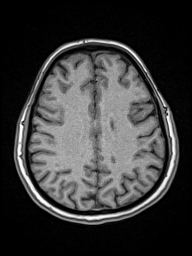
[im 126/173]
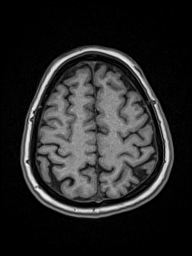
[im 141/173]
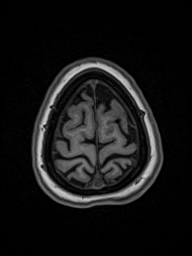
[im 157/173]
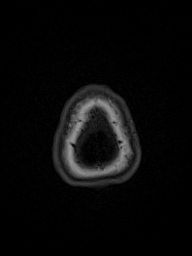
[im 173/173]
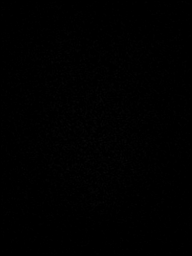

[Series 17: T2 · coronal · 5.0mm · 0.86mm/px · 2 of 30 slices shown (2 of 2)]
[im 1/30]
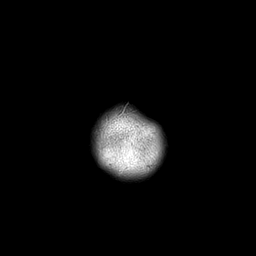
[im 30/30]
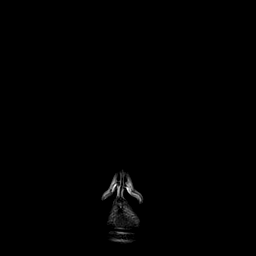

[48 of 48 positions shown; findings below may reference images not displayed]

FINDINGS: Brain: No restricted diffusion to suggest acute or subacute infarct.
No acute hemorrhage no mass, mass effect, or midline shift. No
hemosiderin deposition to suggest remote hemorrhage. Arachnoid cyst
in the right middle cranial fossa trauma anterior to the right
temporal lobe. No hydrocephalus or acute extra-axial collection.
Normal craniocervical junction.

Vascular: Normal arterial flow voids.

Skull and upper cervical spine: Normal marrow signal.

Sinuses/Orbits: Mucous retention cyst in the right sphenoid sinus.
Mucosal thickening in the left maxillary sinus and ethmoid air
cells. The orbits are unremarkable.

Other: The mastoids are well aerated.
IMPRESSION: No acute intracranial process. No evidence of acute or subacute
infarct.

## 2021-09-08 IMAGING — CT CT HEAD CODE STROKE
4 series · 16 of 47 positions shown, 18 images · non-contrast
Comparison: None Available.

CLINICAL DATA: Code stroke.



[Series 3: head wo · axial · 0.42mm/px · z∈[-123,-13]mm · 7 of 30 slices shown, 9 images]
[im 4/30  brain]
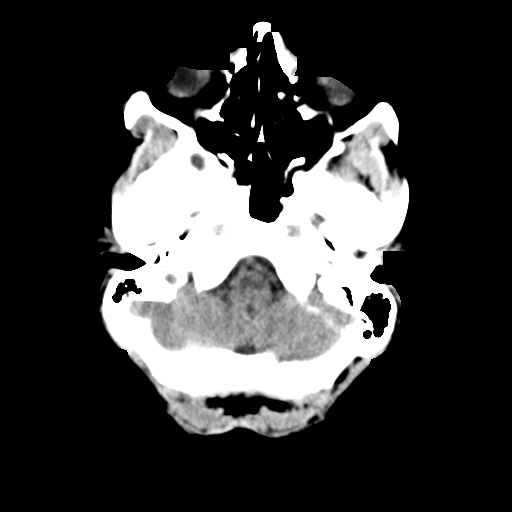
[im 4/30  bone]
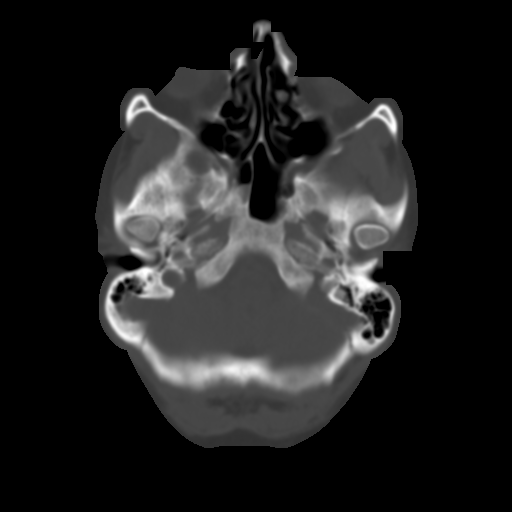
[im 8/30  brain]
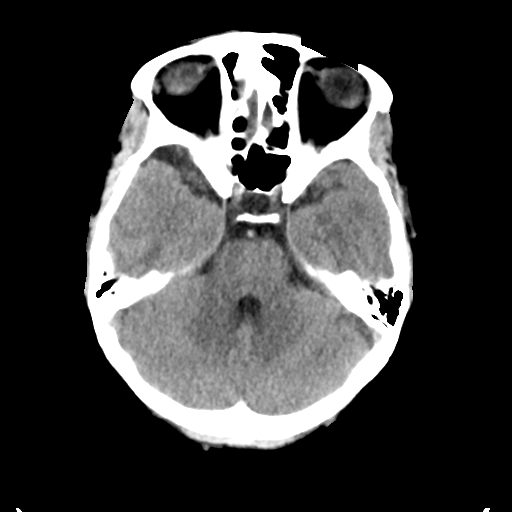
[im 11/30  brain]
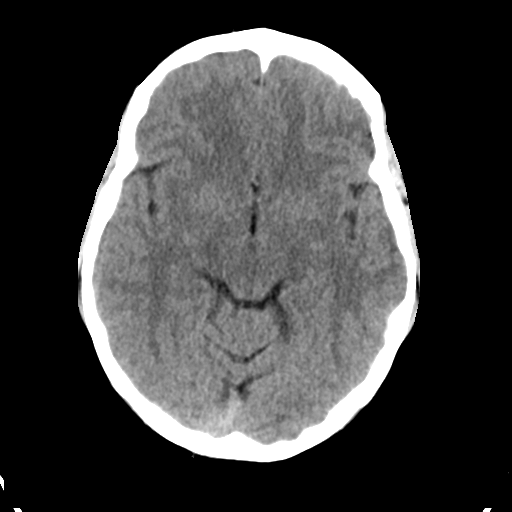
[im 15/30  brain]
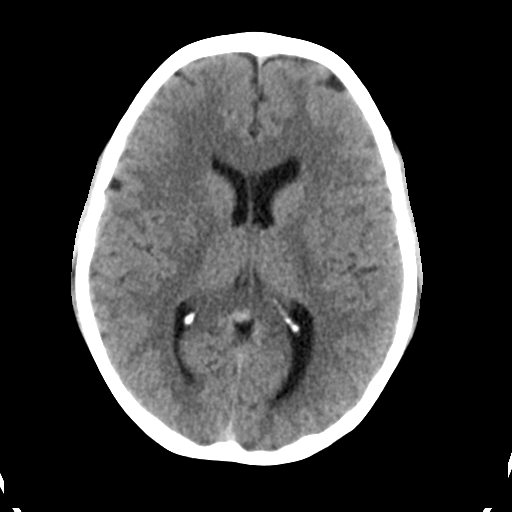
[im 19/30  brain]
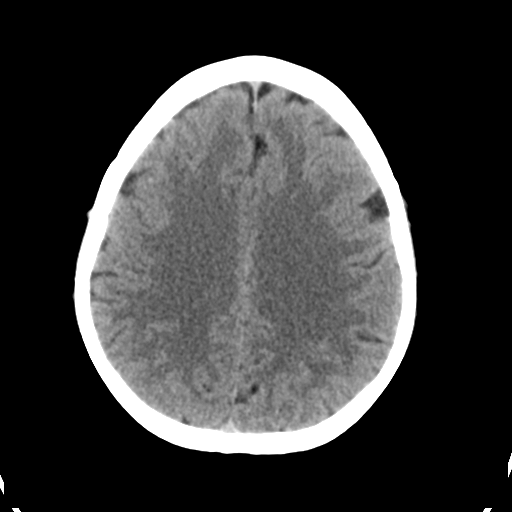
[im 19/30  bone]
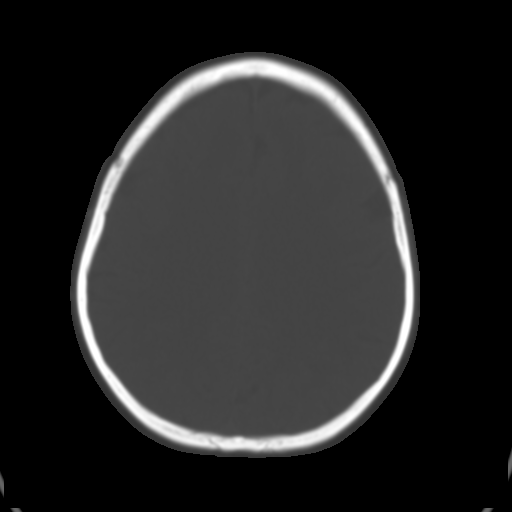
[im 22/30  brain]
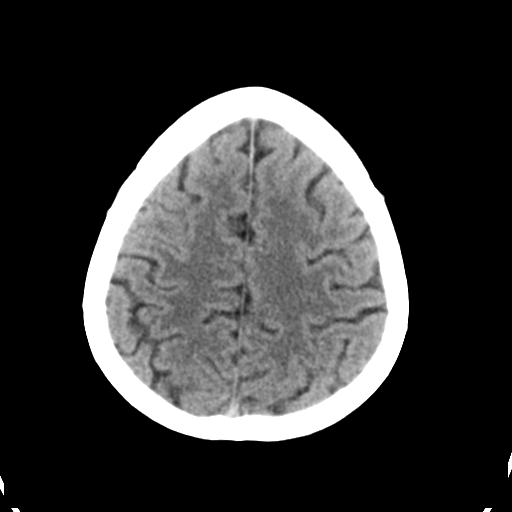
[im 26/30  brain]
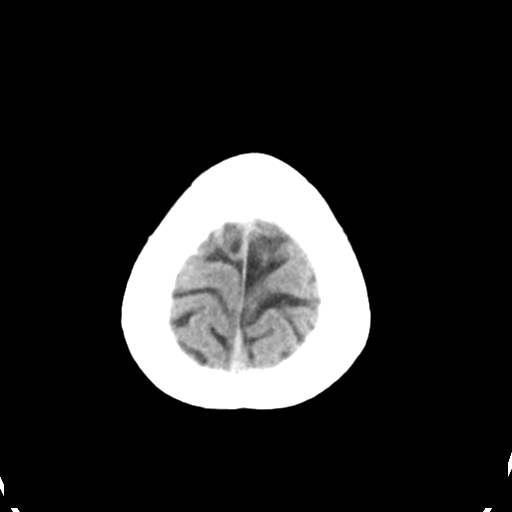

[Series 4: head bone · axial · 0.42mm/px · z∈[-124,-96]mm · 3 of 74 slices shown]
[im 8/74  bone]
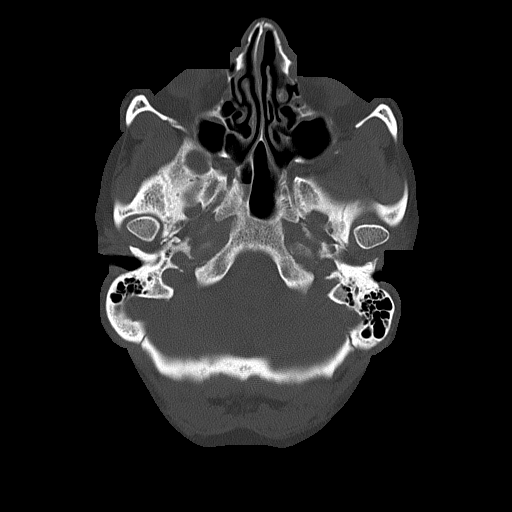
[im 15/74  bone]
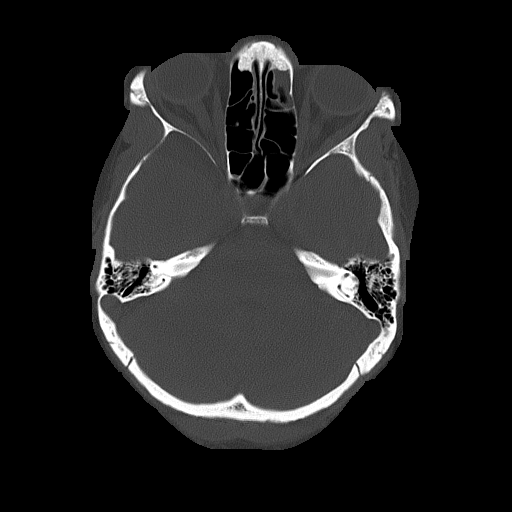
[im 22/74  bone]
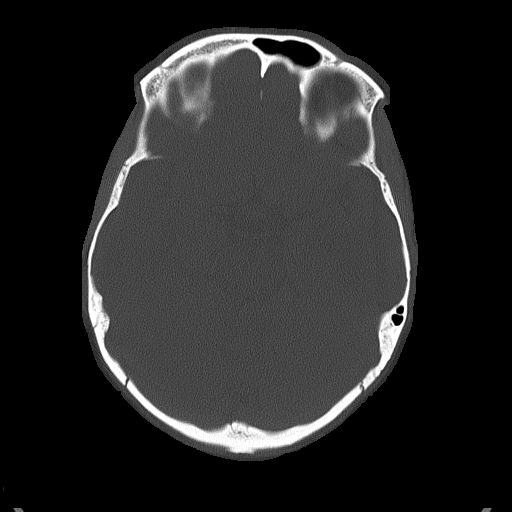

[Series 5: coronal soft tissue · coronal · 0.29mm/px · 3 of 65 slices shown]
[im 22/65  brain]
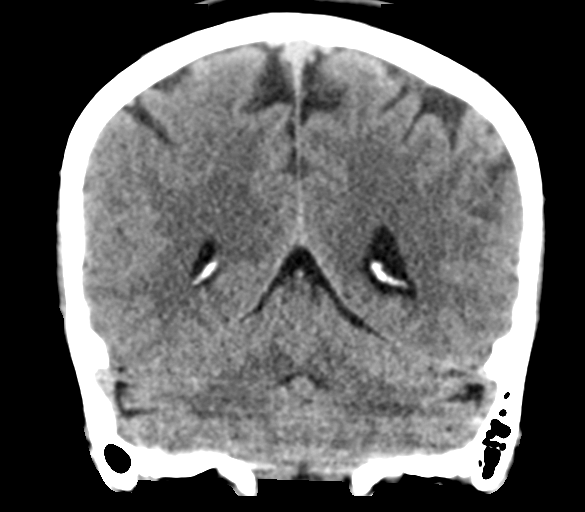
[im 29/65  brain]
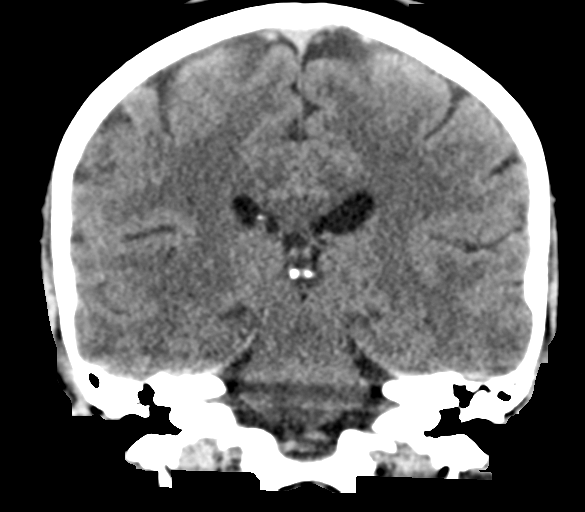
[im 36/65  brain]
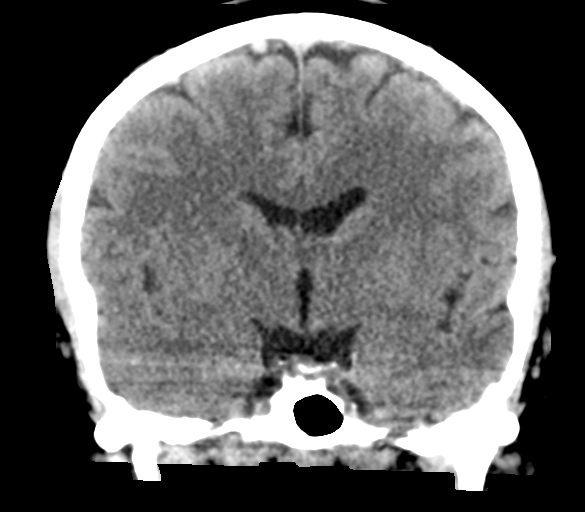

[Series 6: sagittal soft tissue · sagittal · 0.29mm/px · 3 of 56 slices shown]
[im 19/56  brain]
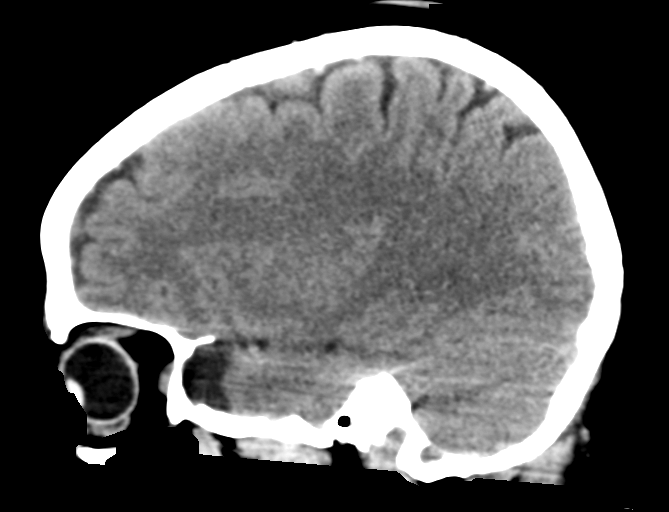
[im 28/56  brain]
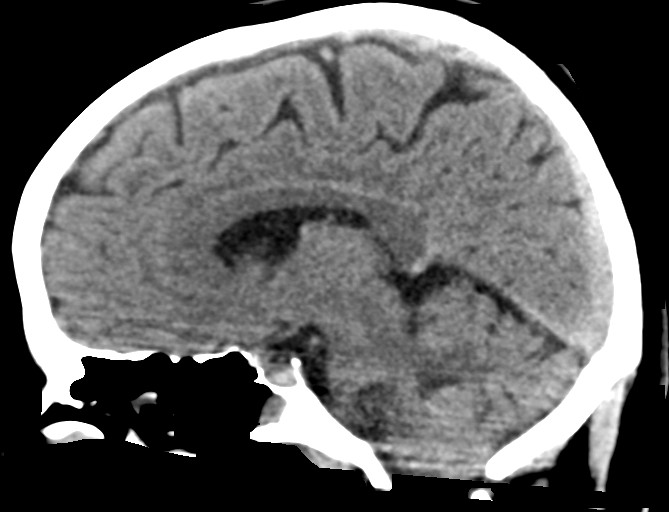
[im 37/56  brain]
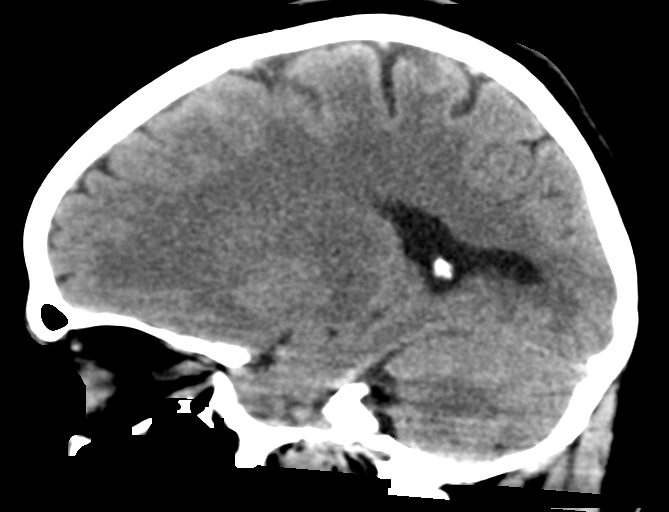

[16 of 47 positions shown; findings below may reference images not displayed]

FINDINGS: Brain: No acute intracranial hemorrhage, mass effect, or edema.
Gray-white differentiation is preserved. Ventricles and sulci are
normal in size and configuration. No extra-axial collection.
Prominence of the extra-axial space of the right middle cranial
fossa may reflect a small arachnoid cyst.

Vascular: No hyperdense vessel.

Skull: Unremarkable.

Sinuses/Orbits: Mild polypoid mucosal thickening. Unremarkable
orbits.

Other: Mastoid air cells are clear.

ASPECTS (Alberta Stroke Program Early CT Score)

- Ganglionic level infarction (caudate, lentiform nuclei, internal
capsule, insula, M1-M3 cortex): 7

- Supraganglionic infarction (M4-M6 cortex): 3

Total score (0-10 with 10 being normal): 10
IMPRESSION: There is no acute intracranial hemorrhage or evidence of acute
infarction. ASPECT score is 10.

These results were communicated to Dr. JUMPER at [DATE] on
[DATE] by text page via the AMION messaging system.

## 2021-09-08 IMAGING — CR DG CHEST 2V
2 series · 2 of 2 positions shown · non-contrast
Comparison: [DATE]

CLINICAL DATA: Chest pain

EXAM:
CHEST - 2 VIEW

[chest pa]
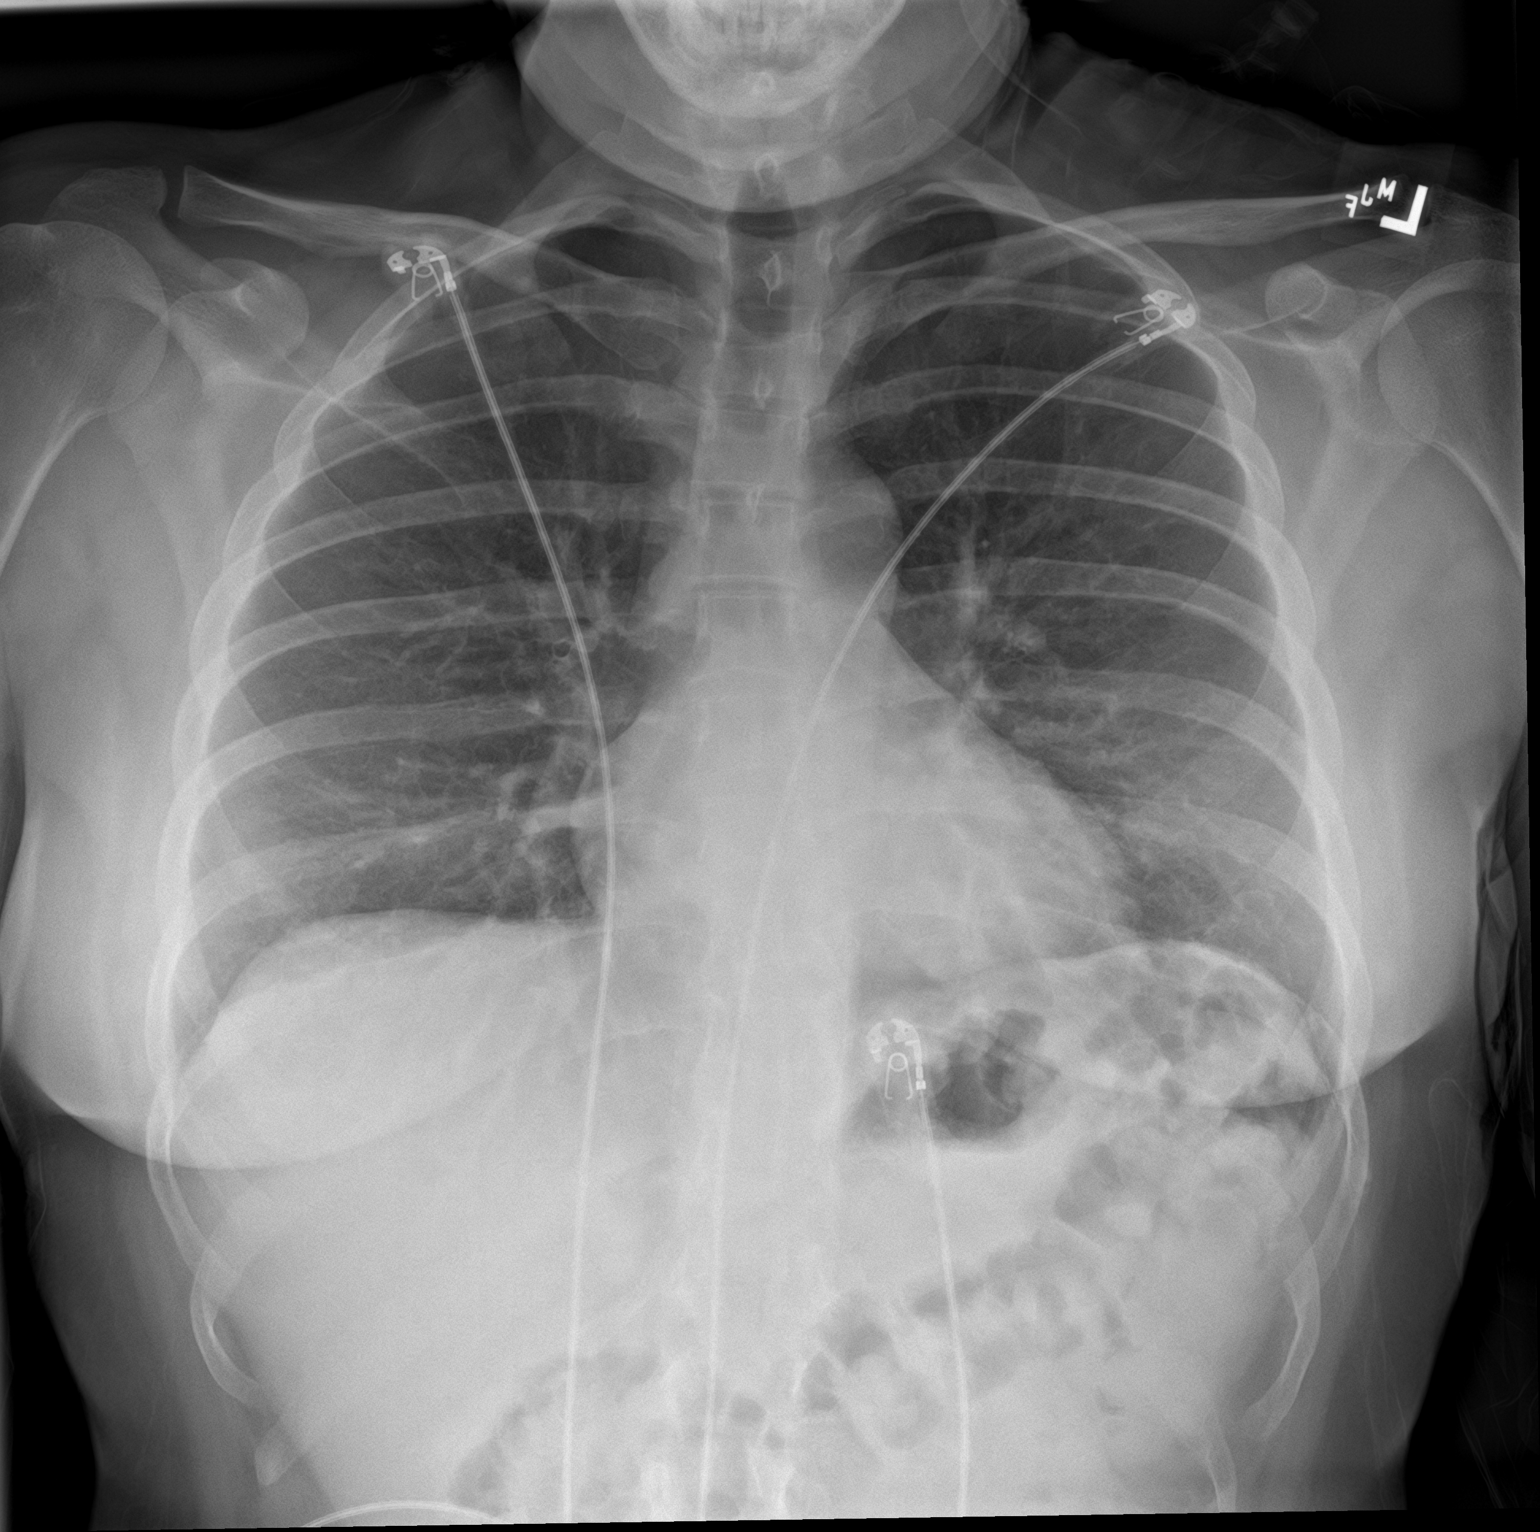

[chest lat]
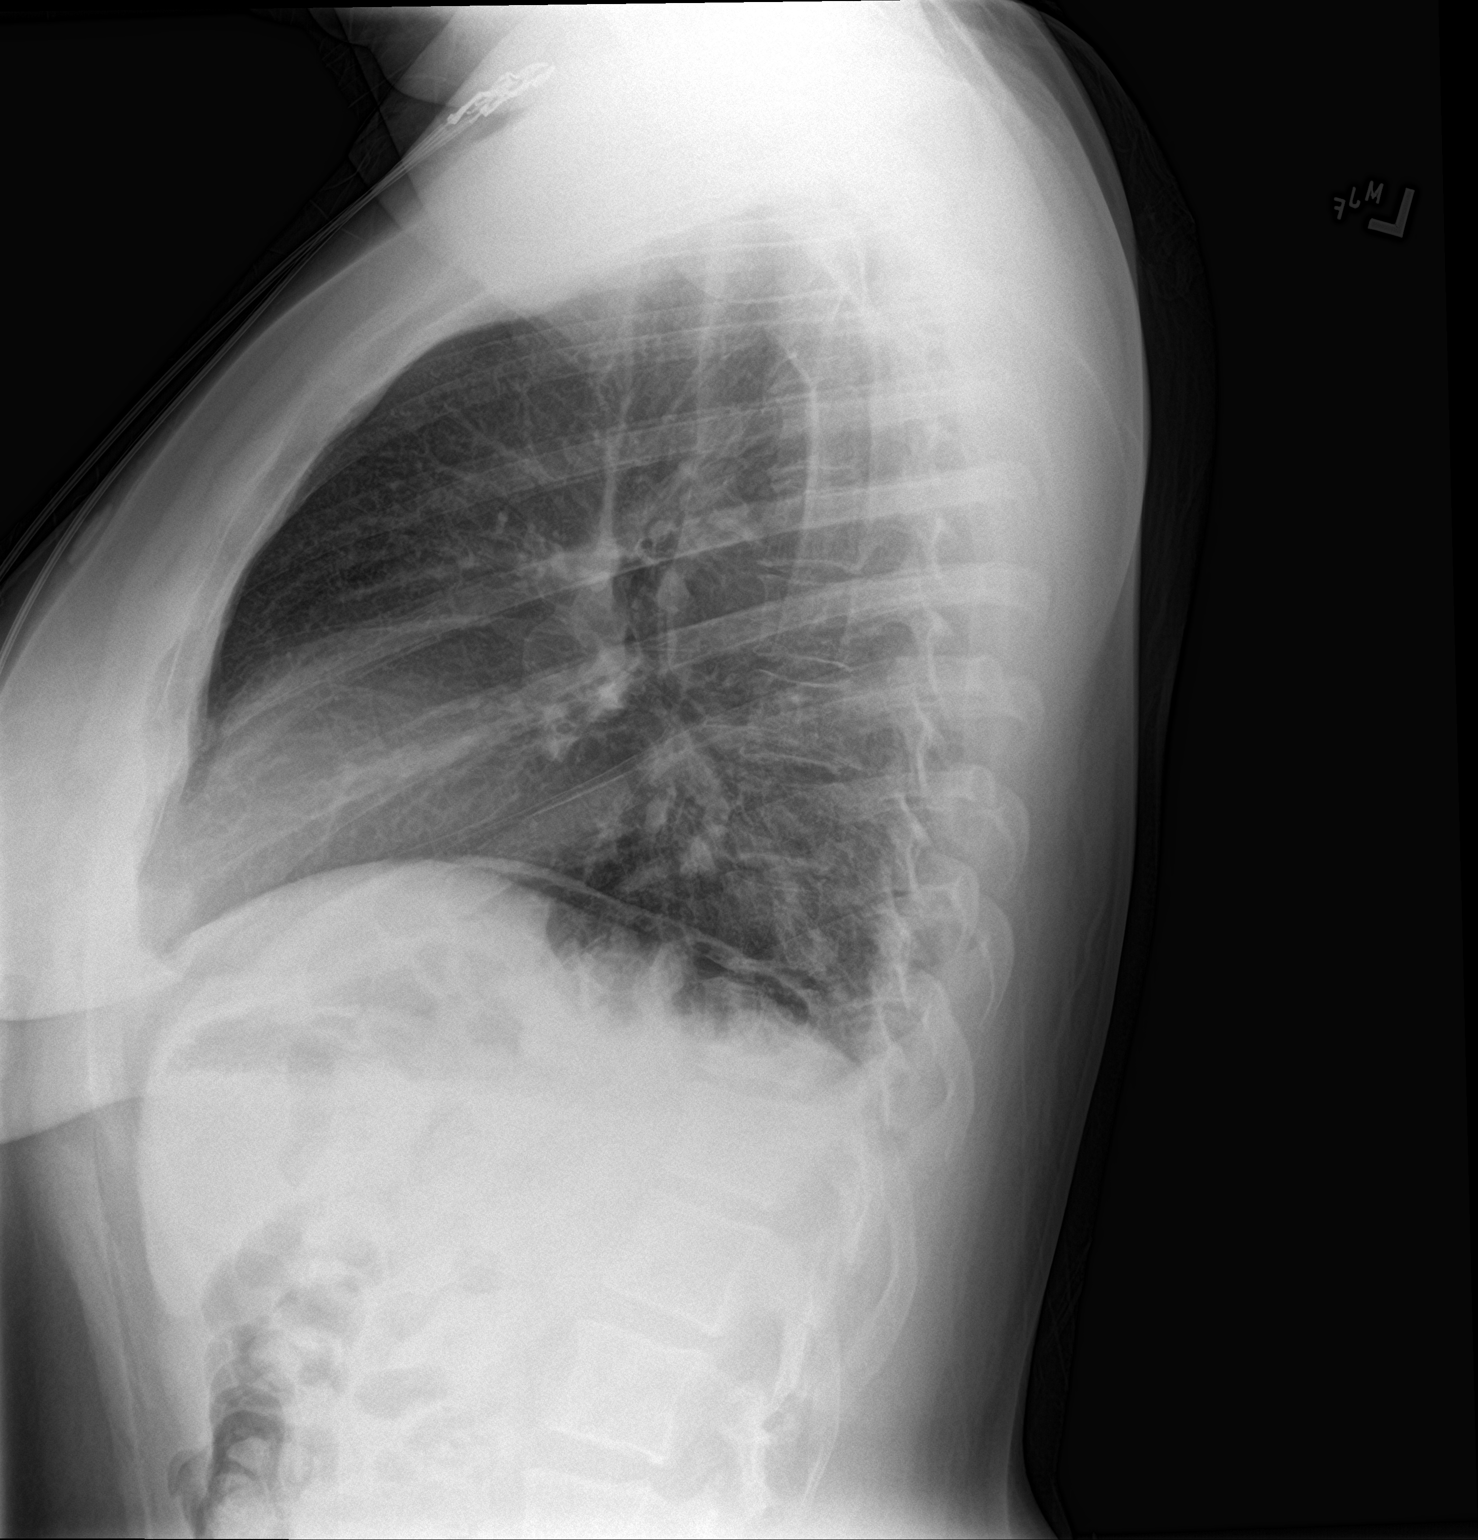

[2 of 2 positions shown; findings below may reference images not displayed]

FINDINGS: Cardiac size is within normal limits. There are no signs of
pulmonary edema. Subtle increased markings are seen in the lower
lung fields. There is poor inspiration. There is no pleural effusion
or pneumothorax.
IMPRESSION: Subtle increased markings in both lower lung fields may be an
artifact caused by poor inspiration or suggest subsegmental
atelectasis/pneumonia.

## 2021-09-08 MED ORDER — IBUPROFEN 600 MG PO TABS: 600.0000 mg | ORAL_TABLET | Freq: Four times a day (QID) | ORAL | 0 refills | Status: AC | PRN

## 2021-09-08 MED ORDER — LIDOCAINE 5 % EX PTCH
1.0000 | MEDICATED_PATCH | CUTANEOUS | Status: DC
  Administered 2021-09-08: 1 via TRANSDERMAL
  Filled 2021-09-08: qty 1

## 2021-09-08 MED ORDER — IOHEXOL 350 MG/ML SOLN
50.0000 mL | Freq: Once | INTRAVENOUS | Status: AC | PRN
Start: 2021-09-08 — End: 2021-09-08
  Administered 2021-09-08: 50 mL via INTRAVENOUS

## 2021-09-08 MED ORDER — SODIUM CHLORIDE 0.9% FLUSH
3.0000 mL | Freq: Once | INTRAVENOUS | Status: AC
  Administered 2021-09-08: 3 mL via INTRAVENOUS

## 2021-09-08 MED ORDER — SODIUM CHLORIDE 0.9 % IV BOLUS
1000.0000 mL | Freq: Once | INTRAVENOUS | Status: AC
  Administered 2021-09-08: 1000 mL via INTRAVENOUS

## 2021-09-08 MED ORDER — LIDOCAINE 5 % EX PTCH: 1.0000 | MEDICATED_PATCH | Freq: Two times a day (BID) | CUTANEOUS | 0 refills | Status: AC

## 2021-09-08 MED ORDER — PREDNISONE 10 MG (21) PO TBPK: ORAL_TABLET | ORAL | 0 refills | Status: DC

## 2021-09-08 MED ORDER — CYCLOBENZAPRINE HCL 5 MG PO TABS: 5.0000 mg | ORAL_TABLET | Freq: Three times a day (TID) | ORAL | 0 refills | Status: AC | PRN

## 2021-09-08 MED ORDER — ACETAMINOPHEN 500 MG PO TABS
1000.0000 mg | ORAL_TABLET | Freq: Once | ORAL | Status: AC
  Administered 2021-09-08: 1000 mg via ORAL
  Filled 2021-09-08: qty 2

## 2021-09-08 MED ORDER — IOHEXOL 350 MG/ML SOLN: 75.0000 mL | Freq: Once | INTRAVENOUS | Status: DC | PRN

## 2021-09-08 NOTE — ED Notes (Signed)
Per md pt will not be getting TNK, code stroke complete

## 2021-09-08 NOTE — ED Notes (Signed)
Pt back from MRI, pt continues to report some pain in her L arm.

## 2021-09-08 NOTE — ED Notes (Signed)
Neuro at bedside.

## 2021-09-08 NOTE — Progress Notes (Signed)
   09/08/21 1500  Clinical Encounter Type  Visited With Patient not available  Visit Type Code   Chaplain responded to Code Stroke. Patient unavailable at time of visit.

## 2021-09-08 NOTE — ED Notes (Signed)
Pt in bed, pt denies neck pain, states that she only has pain in her L arm, reports that pain is a 3/10, resps even and unlabored, no facial droop noted.

## 2021-09-08 NOTE — Progress Notes (Signed)
Code stroke was activated at 1443. Patient was already seen by provider and in CT at time of cart activation. Patient was seen in [place] by neurologist. Returned to room at 1450 with no plans for TNK administration at that time.

## 2021-09-08 NOTE — ED Provider Notes (Signed)
[facility] Provider Note    Event Date/Time   First MD Initiated Contact with Patient 09/08/21 1458     (approximate)   History   Code Stroke   HPI  Christina Marquez is a 36 y.o. female who is otherwise healthy not on any medications who comes in with left arm weakness and tingling that started at 2:00 while driving to work.  Patient reports that she has had a little bit of tightness on the left side of her neck and that this has been going on for the past few weeks.  She reports it felt like a crick in her neck.  She states that while driving to work she had this episode where she felt tingling sensation in her neck down her left arm with some weakness and difficulty seeing.  She reports that this tingling is now all gone but she does have some pain down her left arm.  She denies ever having anything like this previously.  She denies any marijuana use or alcohol use today.     Physical Exam   Triage Vital Signs: ED Triage Vitals [09/08/21 1421]  Enc Vitals Group     BP (!) 153/98     Pulse Rate (!) 113     Resp 18     Temp 98.4 F (36.9 C)     Temp Source Oral     SpO2 97 %     Weight      Height      Head Circumference      Peak Flow      Pain Score 4     Pain Loc      Pain Edu?      Excl. in GC?     Most recent vital signs: Vitals:   09/08/21 1421 09/08/21 1500  BP: (!) 153/98 122/82  Pulse: (!) 113 83  Resp: 18 (!) 24  Temp: 98.4 F (36.9 C)   SpO2: 97% 96%     General: Awake, no distress.  CV:  Good peripheral perfusion.  Resp:  Normal effort.  Abd:  No distention.  Other:  Nerves II to XII are intact.  Equal strength in arms and legs.  Sensation intact throughout. Patient has range of motion of neck is intact.  She reports some fullness to the left side of her neck but not see any evidence of obvious swelling.   ED Results / Procedures / Treatments   Labs (all labs ordered are listed, but only abnormal results are  displayed) Labs Reviewed  CBC - Abnormal; Notable for the following components:      Result Value   WBC 12.3 (*)    All other components within normal limits  DIFFERENTIAL - Abnormal; Notable for the following components:   Neutro Abs 8.3 (*)    All other components within normal limits  COMPREHENSIVE METABOLIC PANEL - Abnormal; Notable for the following components:   Glucose, Bld 107 (*)    Calcium 8.7 (*)    Total Protein 6.3 (*)    Anion gap 3 (*)    All other components within normal limits  CBG MONITORING, ED - Abnormal; Notable for the following components:   Glucose-Capillary 148 (*)    All other components within normal limits  PROTIME-INR  APTT  I-STAT CREATININE, ED  POC URINE PREG, ED  TROPONIN I (HIGH SENSITIVITY)  TROPONIN I (HIGH SENSITIVITY)     EKG  My interpretation of EKG:  Normal sinus rate  of 97 without any ST elevation or T wave inversions, normal intervals  RADIOLOGY I have reviewed the CT head personally interpreted it and do not see any evidence of intracranial hemorrhage    IMPRESSION: No large vessel occlusion, hemodynamically significant stenosis, or evidence of dissection.     PROCEDURES:  Critical Care performed: Yes, see critical care procedure note(s)  .1-3 Lead EKG Interpretation  Performed by: Concha Se, MD Authorized by: Concha Se, MD     Interpretation: normal     ECG rate:  80   ECG rate assessment: normal     Rhythm: sinus rhythm     Ectopy: none     Conduction: normal   .Critical Care  Performed by: Concha Se, MD Authorized by: Concha Se, MD   Critical care provider statement:    Critical care time (minutes):  30   Critical care was necessary to treat or prevent imminent or life-threatening deterioration of the following conditions:  CNS failure or compromise   Critical care was time spent personally by me on the following activities:  Development of treatment plan with patient or surrogate,  discussions with consultants, evaluation of patient's response to treatment, examination of patient, ordering and review of laboratory studies, ordering and review of radiographic studies, ordering and performing treatments and interventions, pulse oximetry, re-evaluation of patient's condition and review of old charts    MEDICATIONS ORDERED IN ED: Medications  sodium chloride flush (NS) 0.9 % injection 3 mL (3 mLs Intravenous Given 09/08/21 1507)  iohexol (OMNIPAQUE) 350 MG/ML injection 50 mL (50 mLs Intravenous Contrast Given 09/08/21 1448)     IMPRESSION / MDM / ASSESSMENT AND PLAN / ED COURSE  I reviewed the triage vital signs and the nursing notes.   Patient's presentation is most consistent with acute presentation with potential threat to life or bodily function.   Patient comes in with a stroke code called from triage.  Differential includes intracranial hemorrhage, stroke, LVO.  Upon my assessment NIH stroke scale is 0.  No indication for TNK.  Patient is already been seen by neurology.  We will add on troponin to evaluate for ACS.  Give patient some Tylenol, fluids, lidocaine patch to help with symptoms.  Troponin was negative PE normal.  Coags normal.  White count slightly elevated but no infectious symptoms  CT head CTA were negative.  Discussed with Dr. Kem Parkinson from neurology who recommended MRI brain.  Also get MRI cervical given the neck pain and the pain going down her arm.  I considered getting a CTA of the neck but this has been going on for a few days and she reports the pain is worse when she moves her neck and when she moves her arm.  Seems more likely related to musculoskeletal and muscle spasms.  She is got equal pulses radially and DP no chest pain radiating to the back and has no history of connective tissue disorders or family history of this.  After discussion with neurology we feel like this is most consistent with muscle spasm.  Patient provided copy of MRI and will  start on some medications.  She is declining pregnancy testing that she has not been sexually active in over a year.  Understands not to drive or work on the [employer]  The patient is on the cardiac monitor to evaluate for evidence of arrhythmia and/or significant heart rate changes.      FINAL CLINICAL IMPRESSION(S) / ED DIAGNOSES   Final diagnoses:  Neck muscle spasm     Rx / DC Orders   ED Discharge Orders          Ordered    lidocaine (LIDODERM) 5 %  Every 12 hours        09/08/21 1959    ibuprofen (ADVIL) 600 MG tablet  Every 6 hours PRN        09/08/21 1959    cyclobenzaprine (FLEXERIL) 5 MG tablet  3 times daily PRN        09/08/21 1959    predniSONE (STERAPRED UNI-PAK 21 TAB) 10 MG (21) TBPK tablet        09/08/21 1959             Note:  This document was prepared using Dragon voice recognition software and may include unintentional dictation errors.   Concha SeFunke, Haislee Corso E, MD 09/08/21 2000

## 2021-09-08 NOTE — Discharge Instructions (Addendum)
Take the medications with Tylenol 1 g every 8 hours to help with pain.  Follow-up with your primary care doctor if any other issues.  Do not drive or work while on the [employer].  IMPRESSION: 1. C6-C7 moderate left neural foraminal narrowing. Additional mild neural foraminal narrowing bilaterally at C3-C4 and C4-C5. 2. No spinal canal stenosis.

## 2021-09-08 NOTE — ED Notes (Signed)
Code  stroke  called  to  [place]  at  [place]

## 2021-09-08 NOTE — ED Triage Notes (Addendum)
Pt here with some left arm weakness and blurriness that started at 1400 while driving on her way to work. Pt states she is having left sided numbness from her head to her left arm. Pt with equal grip strength and no facial droop, pt reports pain down her left arm.

## 2021-09-08 NOTE — Progress Notes (Signed)
CODE STROKE- PHARMACY COMMUNICATION   Time CODE STROKE called/page received: 1435  Time response to CODE STROKE was made (in person or via phone): Immediately  Time Stroke Kit retrieved from [place] (only if needed): N/A, not a candidate for TNK  Name of Provider/Nurse contacted: Dr. Monico Hoar 09/08/2021  3:51 PM
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
## 2021-10-28 ENCOUNTER — Ambulatory Visit
Admission: RE | Admit: 2021-10-28 | Discharge: 2021-10-28 | Disposition: A | Discharge status: Home / Self Care | Payer: Medicaid Other | Source: Ambulatory Visit | Attending: Emergency Medicine | Admitting: Emergency Medicine

## 2021-10-28 VITALS — BP 123/84 | HR 76 | Temp 98.0°F | Resp 17 | Ht 66.0 in | Wt 189.0 lb

## 2021-10-28 DIAGNOSIS — U071 COVID-19: Secondary | ICD-10-CM | POA: Diagnosis not present

## 2021-10-28 MED ORDER — FLUTICASONE PROPIONATE 50 MCG/ACT NA SUSP: 2.0000 | Freq: Every day | NASAL | 0 refills | Status: DC

## 2021-10-28 MED ORDER — IBUPROFEN 600 MG PO TABS: 600.0000 mg | ORAL_TABLET | Freq: Four times a day (QID) | ORAL | 0 refills | Status: DC | PRN

## 2021-10-28 MED ORDER — MOLNUPIRAVIR EUA 200MG CAPSULE: 4.0000 | ORAL_CAPSULE | Freq: Two times a day (BID) | ORAL | 0 refills | Status: AC

## 2021-10-28 NOTE — Discharge Instructions (Addendum)
Stop the TheraFlu.  Finish the [employer], even if you feel better.  Mucinex D, 600 mg of ibuprofen, 1000 mg of Tylenol together 3-4 times a day as needed for pain, saline nasal irrigation with a [facility] [employer] [facility] rinse and distilled water as often as you want, Flonase.  Do not get pregnant for 3 months after finishing the molnupiravir.  Make sure you get out and walk a little bit every day to keep your lungs open.

## 2021-10-28 NOTE — ED Provider Notes (Signed)
HPI  SUBJECTIVE:  Christina Marquez is a 36 y.o. female who presents with a positive home COVID test this morning.  She reports nasal congestion, clear rhinorrhea starting yesterday.  No fevers, body aches, headaches, loss of sense of smell or taste, cough, wheeze, shortness of breath, nausea, vomiting, diarrhea, abdominal pain.  She has been taking TheraFlu severe cold and Ibuprofen with improvement in her symptoms.  Last dose of TheraFlu was within 6 hours of evaluation.  No aggravating factors.  She got 2 doses of the COVID-vaccine.  She has had multiple sick contacts at work, but is not sure what they are sick with.  She has had COVID 3-4 times, last bout was in 2022.  She has no other medical history.  LMP: Now.  Denies the possibility of being pregnant.  PCP: She is planning to establish care at [facility] primary care.  History reviewed. No pertinent past medical history.  Past Surgical History:  Procedure Laterality Date   CESAREAN SECTION  2003   LEEP      Family History  Problem Relation Age of Onset   Diabetes Mother    Emphysema Mother    Allergies Mother    Asthma Mother    Hypertension Mother    Depression Mother    Seizures Father    Emphysema Father    Depression Father    Lung cancer Maternal Grandmother    Heart attack Maternal Grandmother    Leukemia Maternal Grandfather    Breast cancer Maternal Aunt    Heart attack Maternal Aunt    Post-traumatic stress disorder Brother    Anxiety disorder Brother    Asthma Son    Asthma Daughter     Social History   Tobacco Use   Smoking status: Every Day    Packs/day: 0.50    Types: Cigarettes   Smokeless tobacco: Never  Vaping Use   Vaping Use: Never used  Substance Use Topics   Alcohol use: Yes    Alcohol/week: 26.0 standard drinks of alcohol    Types: 24 Cans of beer, 2 Shots of liquor per week    Comment: weekends    Drug use: Yes    Frequency: 7.0 times per week    Types: Marijuana    No current  facility-administered medications for this encounter.  Current Outpatient Medications:    fluticasone (FLONASE) 50 MCG/ACT nasal spray, Place 2 sprays into both nostrils daily., Disp: 16 g, Rfl: 0   ibuprofen (ADVIL) 600 MG tablet, Take 1 tablet (600 mg total) by mouth every 6 (six) hours as needed., Disp: 30 tablet, Rfl: 0   molnupiravir EUA (LAGEVRIO) 200 mg CAPS capsule, Take 4 capsules (800 mg total) by mouth 2 (two) times daily for 5 days., Disp: 40 capsule, Rfl: 0   albuterol (VENTOLIN HFA) 108 (90 Base) MCG/ACT inhaler, Inhale 1-2 puffs into the lungs every 4 (four) hours as needed for wheezing or shortness of breath., Disp: 1 each, Rfl: 0   Spacer/Aero-Holding Chambers (AEROCHAMBER PLUS) inhaler, Use with inhaler, Disp: 1 each, Rfl: 2  No Known Allergies   ROS  As noted in HPI.   Physical Exam  BP 123/84   Pulse 76   Temp 98 F (36.7 C) (Oral)   Resp 17   Ht 5\' 6"  (1.676 m)   Wt 85.7 kg   LMP 10/26/2021   SpO2 98%   BMI 30.51 kg/m   Constitutional: Well developed, well nourished, no acute distress Eyes:  EOMI, conjunctiva normal bilaterally  HENT: Normocephalic, atraumatic,mucus membranes moist Respiratory: Normal inspiratory effort, lungs clear bilaterally Cardiovascular: Normal rate, regular rhythm, no murmurs rubs or gallops GI: nondistended skin: No rash, skin intact Musculoskeletal: no deformities Neurologic: Alert & oriented x 3, no focal neuro deficits Psychiatric: Speech and behavior appropriate   ED Course   Medications - No data to display  No orders of the defined types were placed in this encounter.   No results found for this or any previous visit (from the past 24 hour(s)). No results found.  ED Clinical Impression  1. COVID-19 virus infection      ED Assessment/Plan  Patient with a positive home covid test.  We will not repeat here.  Will treat with Molnupiravir.  Advised patient to not get pregnant for 3 months after finishing the  molnupiravir.  Also, Mucinex D, Tylenol/ibuprofen, saline nasal irrigation, Flonase.  Work note for quarantine for 5 days.  Discussed  MDM, treatment plan, and plan for follow-up with patient.patient agrees with plan.   Meds ordered this encounter  Medications   molnupiravir EUA (LAGEVRIO) 200 mg CAPS capsule    Sig: Take 4 capsules (800 mg total) by mouth 2 (two) times daily for 5 days.    Dispense:  40 capsule    Refill:  0   fluticasone (FLONASE) 50 MCG/ACT nasal spray    Sig: Place 2 sprays into both nostrils daily.    Dispense:  16 g    Refill:  0   ibuprofen (ADVIL) 600 MG tablet    Sig: Take 1 tablet (600 mg total) by mouth every 6 (six) hours as needed.    Dispense:  30 tablet    Refill:  0      *This clinic note was created using Scientist, clinical (histocompatibility and immunogenetics). Therefore, there may be occasional mistakes despite careful proofreading.  ?    Domenick Gong, MD 10/28/21 1432

## 2021-10-28 NOTE — ED Triage Notes (Signed)
Patient to UC with complaints of congestion x2 days. Reports being around multiple sick coworkers, states she had a covid test this morning that was positive.

## 2022-01-09 ENCOUNTER — Ambulatory Visit
Admission: EM | Admit: 2022-01-09 | Discharge: 2022-01-09 | Disposition: A | Discharge status: Home / Self Care | Payer: Medicaid Other | Attending: Physician Assistant | Admitting: Physician Assistant

## 2022-01-09 DIAGNOSIS — L03317 Cellulitis of buttock: Secondary | ICD-10-CM | POA: Diagnosis not present

## 2022-01-09 DIAGNOSIS — L0231 Cutaneous abscess of buttock: Secondary | ICD-10-CM

## 2022-01-09 MED ORDER — DOXYCYCLINE HYCLATE 100 MG PO CAPS: 100.0000 mg | ORAL_CAPSULE | Freq: Two times a day (BID) | ORAL | 0 refills | Status: AC

## 2022-01-09 NOTE — ED Triage Notes (Signed)
Pt c/o bump along right buttcheek x1week  Pt states that it felt like a hair bump but after sitting in a warm bath, it has swollen to the size of a golf ball.   Pt states that it hurts to wear underwear and walk.
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
## 2022-01-09 NOTE — Discharge Instructions (Signed)
-  I have sent antibiotics to pharmacy.  It should be improving over the next couple of days. - Continue to take warm soaks to put hot rags on the area.  This will hopefully help it open up and drain. - If no improvement in the next 3 days or symptoms worsen, please return for reevaluation.  If you develop a fever go to ER.

## 2022-03-29 HISTORY — PX: OTHER SURGICAL HISTORY: SHX169

## 2022-07-27 ENCOUNTER — Ambulatory Visit
Admission: EM | Admit: 2022-07-27 | Discharge: 2022-07-27 | Disposition: A | Discharge status: Home / Self Care | Payer: Medicaid Other | Attending: Nurse Practitioner | Admitting: Nurse Practitioner

## 2022-07-27 DIAGNOSIS — J40 Bronchitis, not specified as acute or chronic: Secondary | ICD-10-CM | POA: Diagnosis not present

## 2022-07-27 DIAGNOSIS — W57XXXA Bitten or stung by nonvenomous insect and other nonvenomous arthropods, initial encounter: Secondary | ICD-10-CM | POA: Diagnosis not present

## 2022-07-27 DIAGNOSIS — S30860A Insect bite (nonvenomous) of lower back and pelvis, initial encounter: Secondary | ICD-10-CM | POA: Diagnosis not present

## 2022-07-27 DIAGNOSIS — J0140 Acute pansinusitis, unspecified: Secondary | ICD-10-CM | POA: Diagnosis not present

## 2022-07-27 MED ORDER — PROMETHAZINE-DM 6.25-15 MG/5ML PO SYRP: 5.0000 mL | ORAL_SOLUTION | Freq: Four times a day (QID) | ORAL | 0 refills | Status: DC | PRN

## 2022-07-27 MED ORDER — ALBUTEROL SULFATE HFA 108 (90 BASE) MCG/ACT IN AERS: 1.0000 | INHALATION_SPRAY | Freq: Four times a day (QID) | RESPIRATORY_TRACT | 0 refills | Status: DC | PRN

## 2022-07-27 MED ORDER — DOXYCYCLINE HYCLATE 100 MG PO CAPS: 100.0000 mg | ORAL_CAPSULE | Freq: Two times a day (BID) | ORAL | 0 refills | Status: AC

## 2022-07-27 NOTE — ED Triage Notes (Signed)
Pt c/o cough,congestion,fever & tick bite x4 days. States she noticed a tick in her back which she removed & sx's started 3-4 days after along w/fever. States sx's now moving to her chest & gets sob at times at rest or w/exertion but worse at night.
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
## 2022-07-27 NOTE — Discharge Instructions (Addendum)
Start doxycycline twice daily for 7 days Promethazine DM as needed for cough.  Please asked medication to make you drowsy.  Do not drink alcohol or drive on this medication Butyryl inhaler as needed Rest and fluids Follow-up with your PCP if your symptoms do not improve Please go to the ER if you have any worsening symptoms

## 2022-10-29 ENCOUNTER — Ambulatory Visit
Admission: RE | Admit: 2022-10-29 | Discharge: 2022-10-29 | Disposition: A | Discharge status: Home / Self Care | Payer: Medicaid Other | Source: Ambulatory Visit | Attending: Physician Assistant | Admitting: Physician Assistant

## 2022-10-29 VITALS — BP 148/100 | HR 73 | Temp 98.7°F | Resp 14 | Ht 66.0 in | Wt 186.9 lb

## 2022-10-29 DIAGNOSIS — R0981 Nasal congestion: Secondary | ICD-10-CM

## 2022-10-29 DIAGNOSIS — J019 Acute sinusitis, unspecified: Secondary | ICD-10-CM

## 2022-10-29 MED ORDER — AMOXICILLIN-POT CLAVULANATE 875-125 MG PO TABS: 1.0000 | ORAL_TABLET | Freq: Two times a day (BID) | ORAL | 0 refills | Status: AC

## 2022-10-29 MED ORDER — IPRATROPIUM BROMIDE 0.06 % NA SOLN
2.0000 | Freq: Four times a day (QID) | NASAL | 0 refills | Status: DC
Start: 2022-10-29 — End: 2023-02-10

## 2022-10-29 NOTE — ED Triage Notes (Signed)
Patient reports cold about 1-2 weeks ago.  Patient states that she had a negative covid test.  Patient reports ongoing right sided nasal congestion and pressure.  Patient thinks that she might have a sinus infection.  Patient denies fevers.
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
## 2022-12-29 ENCOUNTER — Emergency Department: Payer: Medicaid Other

## 2022-12-29 ENCOUNTER — Emergency Department
Admission: EM | Admit: 2022-12-29 | Discharge: 2022-12-29 | Disposition: A | Discharge status: Home / Self Care | Payer: Medicaid Other | Attending: Emergency Medicine | Admitting: Emergency Medicine

## 2022-12-29 ENCOUNTER — Other Ambulatory Visit: Payer: Self-pay

## 2022-12-29 DIAGNOSIS — R059 Cough, unspecified: Secondary | ICD-10-CM | POA: Diagnosis present

## 2022-12-29 DIAGNOSIS — J40 Bronchitis, not specified as acute or chronic: Secondary | ICD-10-CM | POA: Insufficient documentation

## 2022-12-29 DIAGNOSIS — Z20822 Contact with and (suspected) exposure to covid-19: Secondary | ICD-10-CM | POA: Insufficient documentation

## 2022-12-29 DIAGNOSIS — B349 Viral infection, unspecified: Secondary | ICD-10-CM | POA: Insufficient documentation

## 2022-12-29 DIAGNOSIS — Z716 Tobacco abuse counseling: Secondary | ICD-10-CM | POA: Insufficient documentation

## 2022-12-29 DIAGNOSIS — F17210 Nicotine dependence, cigarettes, uncomplicated: Secondary | ICD-10-CM | POA: Diagnosis not present

## 2022-12-29 LAB — RESP PANEL BY RT-PCR (RSV, FLU A&B, COVID)  RVPGX2
Influenza A by PCR: NEGATIVE
Influenza B by PCR: NEGATIVE
Resp Syncytial Virus by PCR: NEGATIVE
SARS Coronavirus 2 by RT PCR: NEGATIVE

## 2022-12-29 LAB — GROUP A STREP BY PCR: Group A Strep by PCR: NOT DETECTED

## 2022-12-29 MED ORDER — PREDNISONE 50 MG PO TABS: 50.0000 mg | ORAL_TABLET | Freq: Every day | ORAL | 0 refills | Status: AC

## 2022-12-29 MED ORDER — NICOTINE 14 MG/24HR TD PT24: 14.0000 mg | MEDICATED_PATCH | TRANSDERMAL | 3 refills | Status: DC

## 2022-12-29 MED ORDER — ALBUTEROL SULFATE HFA 108 (90 BASE) MCG/ACT IN AERS: 2.0000 | INHALATION_SPRAY | Freq: Four times a day (QID) | RESPIRATORY_TRACT | 2 refills | Status: DC | PRN

## 2022-12-29 NOTE — ED Triage Notes (Signed)
Pt sts that she has been having a sore throat and flu like s/s.

## 2022-12-29 NOTE — ED Notes (Signed)
First nurse note-pt brought in via ems from work   pt has nasal congestion and cough.  Bp 151/88, p-80, oxygen sats 98% rm air  fsbs 128 per ems.  Pt alert.

## 2022-12-29 NOTE — Discharge Instructions (Addendum)
You are seen in the emergency department for cough and not feeling well.  You had a chest x-ray done that did not show any signs of pneumonia.  Your strep test was negative.  Your COVID and your influenza test were negative.  You did have some mild wheezing on exam.  You are given a prescription for albuterol inhaler and for a steroid.  Do not feel that you need an antibiotic at this time.  You are given information to follow-up closely with your primary care physician.  Return to the emergency department for any ongoing or worsening symptoms.  Work on trying to stop smoking :) you are given a prescription for nicotine patches.  Thank you for choosing [place] for your health care, it was my pleasure to care for you today!  Corena Herter, MD

## 2022-12-29 NOTE — ED Notes (Signed)
Patient verbalizes understanding of discharge instructions. Opportunity for questioning and answers were provided. Armband removed by staff, pt discharged from ED. Ambulated out to lobby with mother  

## 2022-12-29 NOTE — ED Provider Notes (Signed)
[facility] Provider Note    Event Date/Time   First MD Initiated Contact with Patient 12/29/22 2000     (approximate)   History   Influenza   HPI  Christina Marquez is a 37 y.o. female past medical history significant for tobacco use who presents to the emergency department with cough and shortness of breath.  Cough and shortness of breath started today.  Does endorse recent congestion.  Wheezing last week.  Smokes approximately half of a pack of cigarettes on a daily basis.  No prior diagnosis of COPD.  Denies any chest pain.  No history of DVT or PE.     Physical Exam   Triage Vital Signs: ED Triage Vitals  Encounter Vitals Group     BP 12/29/22 1847 129/88     Systolic BP Percentile --      Diastolic BP Percentile --      Pulse Rate 12/29/22 1847 86     Resp 12/29/22 1847 17     Temp 12/29/22 1846 98.2 F (36.8 C)     Temp Source 12/29/22 1846 Oral     SpO2 12/29/22 1847 97 %     Weight 12/29/22 1846 193 lb (87.5 kg)     Height 12/29/22 1846 5\' 6"  (1.676 m)     Head Circumference --      Peak Flow --      Pain Score 12/29/22 1846 7     Pain Loc --      Pain Education --      Exclude from Growth Chart --     Most recent vital signs: Vitals:   12/29/22 1846 12/29/22 1847  BP:  129/88  Pulse:  86  Resp:  17  Temp: 98.2 F (36.8 C)   SpO2:  97%    Physical Exam Constitutional:      Appearance: She is well-developed.  HENT:     Head: Atraumatic.  Eyes:     Conjunctiva/sclera: Conjunctivae normal.  Cardiovascular:     Rate and Rhythm: Regular rhythm.  Pulmonary:     Effort: No respiratory distress.     Breath sounds: Wheezing present.  Abdominal:     General: There is no distension.  Musculoskeletal:        General: Normal range of motion.     Cervical back: Normal range of motion.     Right lower leg: No edema.     Left lower leg: No edema.  Skin:    General: Skin is warm.     Capillary Refill: Capillary refill takes less  than 2 seconds.  Neurological:     Mental Status: She is alert. Mental status is at baseline.     IMPRESSION / MDM / ASSESSMENT AND PLAN / ED COURSE  I reviewed the triage vital signs and the nursing notes.  Differential diagnosis including bronchitis, viral illness including COVID/influenza, pneumonia.  Low suspicion for pulmonary embolism, low risk Wells criteria, PERC negative.  Low suspicion for ACS, no chest pain.  Low suspicion for strep throat.  RADIOLOGY I independently reviewed imaging, my interpretation of imaging: Chest x-ray with no focal findings consistent with pneumonia  LABS (all labs ordered are listed, but only abnormal results are displayed) Labs interpreted as -    Labs Reviewed  RESP PANEL BY RT-PCR (RSV, FLU A&B, COVID)  [id number]  GROUP A STREP BY PCR     MDM  Does have mild wheezing on exam.  Chest x-ray with no  focal findings consistent with bacterial pneumonia.  COVID and influenza are negative.  Strep test is negative.  Most likely with acute bronchitis.  Started on prednisone and albuterol.  Given return precautions.  Discussed at length smoking cessation.  Given a prescription for nicotine patches.     PROCEDURES:  Critical Care performed: No  Procedures  Patient's presentation is most consistent with acute presentation with potential threat to life or bodily function.   MEDICATIONS ORDERED IN ED: Medications - No data to display  FINAL CLINICAL IMPRESSION(S) / ED DIAGNOSES   Final diagnoses:  Viral illness  Encounter for smoking cessation counseling  Bronchitis     Rx / DC Orders   ED Discharge Orders          Ordered    Ambulatory Referral to Primary Care (Establish Care)        12/29/22 2007    albuterol (VENTOLIN HFA) 108 (90 Base) MCG/ACT inhaler  Every 6 hours PRN        12/29/22 2007    predniSONE (DELTASONE) 50 MG tablet  Daily with breakfast        12/29/22 2007    nicotine (NICODERM CQ - DOSED IN MG/24 HOURS) 14  mg/24hr patch  Every 24 hours        12/29/22 2033             Note:  This document was prepared using Dragon voice recognition software and may include unintentional dictation errors.   Corena Herter, MD 12/30/22 2213

## 2023-02-10 ENCOUNTER — Ambulatory Visit
Admission: EM | Admit: 2023-02-10 | Discharge: 2023-02-10 | Disposition: A | Discharge status: Home / Self Care | Payer: Medicaid Other | Attending: Physician Assistant | Admitting: Physician Assistant

## 2023-02-10 DIAGNOSIS — R0981 Nasal congestion: Secondary | ICD-10-CM | POA: Diagnosis present

## 2023-02-10 DIAGNOSIS — J069 Acute upper respiratory infection, unspecified: Secondary | ICD-10-CM | POA: Diagnosis present

## 2023-02-10 LAB — SARS CORONAVIRUS 2 BY RT PCR: SARS Coronavirus 2 by RT PCR: NEGATIVE

## 2023-02-10 MED ORDER — PROMETHAZINE-DM 6.25-15 MG/5ML PO SYRP: 5.0000 mL | ORAL_SOLUTION | Freq: Four times a day (QID) | ORAL | 0 refills | Status: DC | PRN

## 2023-02-10 MED ORDER — IPRATROPIUM BROMIDE 0.06 % NA SOLN: 2.0000 | Freq: Four times a day (QID) | NASAL | 0 refills | Status: DC
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
## 2023-02-10 NOTE — Discharge Instructions (Addendum)
URI/COLD SYMPTOMS: Your exam today is consistent with a viral illness. Antibiotics are not indicated at this time. Use medications as directed, including cough syrup, nasal saline, and decongestants. Your symptoms should improve over the next few days and resolve within 7-10 days. Increase rest and fluids. F/u if symptoms worsen or predominate such as sore throat, ear pain, productive cough, shortness of breath, or if you develop high fevers or worsening fatigue over the next several days.    -We will call if COVID test is positive.  If positive you should isolate until you are feeling better, probably about 3 more days.  Wear a mask if you are coughing. - If you do not hear from [place] COVID-negative.  Results will be on MyChart.

## 2023-02-10 NOTE — ED Triage Notes (Signed)
Pt c/o cough & congestion x2 days. Has tried mucinex w/o relief.

## 2023-03-30 NOTE — L&D Delivery Note (Signed)
 Delivery Note   Christina Marquez is a 38 y.o. G3P2002 at [redacted]w[redacted]d Estimated Date of Delivery: 04/26/24  PRE-OPERATIVE DIAGNOSIS:  1) [redacted]w[redacted]d pregnancy 2) Fetal demise 3) History of c-section with desire for VBAC 4) Induction of labor  POST-OPERATIVE DIAGNOSIS:  1) [redacted]w[redacted]d pregnancy s/p Vaginal, Spontaneous 2) Stillborn female  Delivery Type: Vaginal, Spontaneous   Delivery Anesthesia: Epidural  Labor Complications: None    ESTIMATED BLOOD LOSS: 120 ml    FINDINGS:   Information for the patient's newborn:  Floyd, Girl Vanity [[id number]]  Stillborn female  Birth Weight:   APGAR: 0, 0  Newborn Delivery   Birth date/time: 02/26/2024 20:12:00 Delivery type: Vaginal, Spontaneous    SPECIMENS:   PLACENTA:   Appearance: Intact   Removal: Spontaneous     Disposition: to pathology  Cord Blood: not collected, mother of baby A POS  DISPOSITION:  Infant in delivery room with family for now.  NARRATIVE SUMMARY: Labor course:  Christina Marquez is a [id number] at [redacted]w[redacted]d who presented to Labor & Delivery for induction of labor for fetal demise diagnosed on 11/27. She had a history of c-section with her first pregnancy, and a VBAC with her second. She declined repeat c-section for this delivery. Immediate IOL was offered, but she declined. Upon admission this morning, her initial cervical exam was closed/thick/hi. Low dose pitocin was started and cook catheter was placed some hours later. An epidural was utilized for pain management and worked well for her. When cook catheter came out, AROM was performed for moderate amount brown fluid. She felt an urge to push approximately 2.5 hours later.  With maternal pushing effort, she birthed a stillborn female infant at 2012. Head birthed DOA. There was a loose cord around the infant's ankle. The cord was doubly clamped and cut. The infant was taken to the warmer per Christina Marquez's request. She does not want to hold the baby or see her at this time. Momentos were  collected. Infant appears phenotypically normal. There is some sloughing of the skin on the neck and soles of the feet and right elbow. The placenta delivered spontaneously and was noted to be intact with a 3VC. Notable for marginal (almost velamentous) cord insertion. Placenta sent to pathology. A perineal and vaginal examination was performed. There were no lacerations or abrasions.  Christina Marquez expresses a desire to be discharged home ASAP. Discussed possibility of being discharged home tonight in approximately 6 hours if stable and meeting discharge criteria.  Christina Marquez, CNM 02/26/2024 8:33 PM

## 2023-07-14 ENCOUNTER — Ambulatory Visit (LOCAL_COMMUNITY_HEALTH_CENTER): Admitting: Nurse Practitioner

## 2023-07-14 ENCOUNTER — Encounter: Payer: Self-pay | Admitting: Nurse Practitioner

## 2023-07-14 VITALS — BP 128/80 | HR 88 | Ht 65.0 in | Wt 201.0 lb

## 2023-07-14 DIAGNOSIS — Z01419 Encounter for gynecological examination (general) (routine) without abnormal findings: Secondary | ICD-10-CM

## 2023-07-14 DIAGNOSIS — Z113 Encounter for screening for infections with a predominantly sexual mode of transmission: Secondary | ICD-10-CM

## 2023-07-14 DIAGNOSIS — Z3009 Encounter for other general counseling and advice on contraception: Secondary | ICD-10-CM

## 2023-07-14 DIAGNOSIS — Z309 Encounter for contraceptive management, unspecified: Secondary | ICD-10-CM | POA: Diagnosis not present

## 2023-07-14 DIAGNOSIS — N393 Stress incontinence (female) (male): Secondary | ICD-10-CM | POA: Insufficient documentation

## 2023-07-14 LAB — HM HEPATITIS C SCREENING LAB: HM Hepatitis Screen: POSITIVE

## 2023-07-14 LAB — HM HIV SCREENING LAB: HM HIV Screening: NEGATIVE

## 2023-07-14 LAB — WET PREP FOR TRICH, YEAST, CLUE
Trichomonas Exam: NEGATIVE
Yeast Exam: NEGATIVE

## 2023-07-14 LAB — HEPATITIS B SURFACE ANTIGEN

## 2023-07-14 NOTE — Progress Notes (Signed)
 Patient is here for PE visit. FP packet given to patient and contents reviewed. Wet prep results reviewed with pt, no treatment required, condoms declined. Austine Lefort, RN.
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
## 2023-07-15 LAB — HGB A1C W/O EAG: Hgb A1c MFr Bld: 5.4 % (ref 4.8–5.6)

## 2023-07-18 ENCOUNTER — Encounter: Payer: Self-pay | Admitting: Family Medicine

## 2023-07-20 LAB — GONOCOCCUS CULTURE

## 2023-07-24 LAB — IGP, APTIMA HPV
HPV Aptima: NEGATIVE
PAP Smear Comment: 0

## 2023-07-29 ENCOUNTER — Encounter: Payer: Self-pay | Admitting: Nurse Practitioner

## 2023-07-29 NOTE — Progress Notes (Signed)
 NILM HPV-, needs r/p in 5 years. Please send letter to pt. Thanks!

## 2023-08-15 ENCOUNTER — Emergency Department
Admission: EM | Admit: 2023-08-15 | Discharge: 2023-08-15 | Discharge status: Against Medical Advice | Attending: Emergency Medicine | Admitting: Emergency Medicine

## 2023-08-15 ENCOUNTER — Other Ambulatory Visit: Payer: Self-pay

## 2023-08-15 ENCOUNTER — Ambulatory Visit

## 2023-08-15 ENCOUNTER — Encounter: Payer: Self-pay | Admitting: Intensive Care

## 2023-08-15 ENCOUNTER — Emergency Department

## 2023-08-15 VITALS — BP 136/65 | Ht 65.0 in | Wt 199.0 lb

## 2023-08-15 DIAGNOSIS — Z5321 Procedure and treatment not carried out due to patient leaving prior to being seen by health care provider: Secondary | ICD-10-CM | POA: Diagnosis not present

## 2023-08-15 DIAGNOSIS — Z3A01 Less than 8 weeks gestation of pregnancy: Secondary | ICD-10-CM | POA: Insufficient documentation

## 2023-08-15 DIAGNOSIS — O26891 Other specified pregnancy related conditions, first trimester: Secondary | ICD-10-CM | POA: Insufficient documentation

## 2023-08-15 DIAGNOSIS — Z3201 Encounter for pregnancy test, result positive: Secondary | ICD-10-CM

## 2023-08-15 DIAGNOSIS — Z309 Encounter for contraceptive management, unspecified: Secondary | ICD-10-CM | POA: Diagnosis not present

## 2023-08-15 DIAGNOSIS — R102 Pelvic and perineal pain: Secondary | ICD-10-CM | POA: Diagnosis not present

## 2023-08-15 LAB — CBC WITH DIFFERENTIAL/PLATELET
Abs Immature Granulocytes: 0.05 10*3/uL (ref 0.00–0.07)
Basophils Absolute: 0.1 10*3/uL (ref 0.0–0.1)
Basophils Relative: 1 %
Eosinophils Absolute: 0.2 10*3/uL (ref 0.0–0.5)
Eosinophils Relative: 2 %
HCT: 44.1 % (ref 36.0–46.0)
Hemoglobin: 14.6 g/dL (ref 12.0–15.0)
Immature Granulocytes: 0 %
Lymphocytes Relative: 26 %
Lymphs Abs: 3.2 10*3/uL (ref 0.7–4.0)
MCH: 28.8 pg (ref 26.0–34.0)
MCHC: 33.1 g/dL (ref 30.0–36.0)
MCV: 87 fL (ref 80.0–100.0)
Monocytes Absolute: 0.9 10*3/uL (ref 0.1–1.0)
Monocytes Relative: 7 %
Neutro Abs: 7.9 10*3/uL — ABNORMAL HIGH (ref 1.7–7.7)
Neutrophils Relative %: 64 %
Platelets: 325 10*3/uL (ref 150–400)
RBC: 5.07 MIL/uL (ref 3.87–5.11)
RDW: 14.7 % (ref 11.5–15.5)
WBC: 12.4 10*3/uL — ABNORMAL HIGH (ref 4.0–10.5)
nRBC: 0 % (ref 0.0–0.2)

## 2023-08-15 LAB — URINALYSIS, ROUTINE W REFLEX MICROSCOPIC
Bilirubin Urine: NEGATIVE
Glucose, UA: NEGATIVE mg/dL
Hgb urine dipstick: NEGATIVE
Ketones, ur: NEGATIVE mg/dL
Leukocytes,Ua: NEGATIVE
Nitrite: NEGATIVE
Protein, ur: NEGATIVE mg/dL
Specific Gravity, Urine: 1.005 (ref 1.005–1.030)
pH: 6 (ref 5.0–8.0)

## 2023-08-15 LAB — COMPREHENSIVE METABOLIC PANEL WITH GFR
ALT: 15 U/L (ref 0–44)
AST: 15 U/L (ref 15–41)
Albumin: 3.8 g/dL (ref 3.5–5.0)
Alkaline Phosphatase: 65 U/L (ref 38–126)
Anion gap: 7 (ref 5–15)
BUN: 11 mg/dL (ref 6–20)
CO2: 22 mmol/L (ref 22–32)
Calcium: 8.8 mg/dL — ABNORMAL LOW (ref 8.9–10.3)
Chloride: 108 mmol/L (ref 98–111)
Creatinine, Ser: 0.75 mg/dL (ref 0.44–1.00)
GFR, Estimated: >60 mL/min (ref >60)
Glucose, Bld: 90 mg/dL (ref 70–99)
Potassium: 3.5 mmol/L (ref 3.5–5.1)
Sodium: 137 mmol/L (ref 135–145)
Total Bilirubin: 0.5 mg/dL (ref 0.0–1.2)
Total Protein: 6.8 g/dL (ref 6.5–8.1)

## 2023-08-15 LAB — POC URINE PREG, ED: Preg Test, Ur: POSITIVE — AB

## 2023-08-15 LAB — HCG, QUANTITATIVE, PREGNANCY: hCG, Beta Chain, Quant, S: 84 m[IU]/mL — ABNORMAL HIGH (ref <5)

## 2023-08-15 LAB — ABO/RH: ABO/RH(D): A POS

## 2023-08-15 LAB — PREGNANCY, URINE: Preg Test, Ur: POSITIVE — AB

## 2023-08-15 MED ORDER — PRENATAL 27-0.8 MG PO TABS: 1.0000 | ORAL_TABLET | Freq: Every day | ORAL | Status: DC

## 2023-08-15 NOTE — ED Triage Notes (Signed)
 Patient reports she is [redacted] weeks pregnant. Patient c/o pelvic pressure and abdominal pain. Consistent pain for two days. Denies vaginal discharge.  Last menstrual cycle 07/21/23

## 2023-08-15 NOTE — ED Provider Triage Note (Signed)
 Emergency Medicine Provider Triage Evaluation Note  Christina Marquez , a 38 y.o. female  was evaluated in triage.  Pt complains of lower pelvic pain. Currently [redacted] weeks pregnant. Was advised by the health department to be further evaluated in the ED.  Review of Systems  Positive:  Negative:   Physical Exam  BP (!) 126/96 (BP Location: Left Arm)   Pulse 91   Temp 98.9 F (37.2 C) (Oral)   Resp 16   LMP 07/21/2023 (Within Days)   SpO2 98%  Gen:   Awake, no distress   Resp:  Normal effort  MSK:   Moves extremities without difficulty  Other:  Bilateral pelvic pain on palpation   Medical Decision Making  Medically screening exam initiated at 5:07 PM.  Appropriate orders placed.  Alishia Lebo was informed that the remainder of the evaluation will be completed by another provider, this initial triage assessment does not replace that evaluation, and the importance of remaining in the ED until their evaluation is complete.   Phyllis Breeze, Wilma Michaelson A, PA-C 08/15/23 1708

## 2023-08-15 NOTE — ED Notes (Signed)
 No answer when called several times from lobby

## 2023-08-15 NOTE — Progress Notes (Signed)
 UPT positive. Positive pregnancy packet given and reviewed. Plans prenatal care at [facility]. Advised to establish prenatal care soon. Local Pregnancy resource list given.   Patient states she has "lot of emotional stress." Pregnancy unplanned, patient is tearful today. PHQ = 9 today. Denies thoughts of harming self/others. Has support from family and FOB. Offered A Galvin Jules, LCSW contact card, but patient states she has one. Local behavioral resources given, such as [facility] urgent care and [facility] #.   The patient was dispensed prenatal vitamins #100 today per SO Dr Bohdan Bush. I provided counseling today regarding the medication. We discussed the medication, the side effects and when to call clinic. Patient given the opportunity to ask questions. Questions answered.    Patient plans to contact medicaid caseworker to change medicaid to pregnancy medicaid. Christina Arreaga, RN

## 2023-08-15 NOTE — ED Notes (Signed)
No answer when called several times from lobby; no answer when phone # listed in chart called 

## 2023-08-29 ENCOUNTER — Telehealth

## 2023-08-29 DIAGNOSIS — Z3491 Encounter for supervision of normal pregnancy, unspecified, first trimester: Secondary | ICD-10-CM | POA: Diagnosis not present

## 2023-08-29 DIAGNOSIS — Z3A00 Weeks of gestation of pregnancy not specified: Secondary | ICD-10-CM

## 2023-08-29 DIAGNOSIS — O0990 Supervision of high risk pregnancy, unspecified, unspecified trimester: Secondary | ICD-10-CM | POA: Insufficient documentation
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
## 2023-09-12 ENCOUNTER — Ambulatory Visit
Admission: RE | Admit: 2023-09-12 | Discharge: 2023-09-12 | Disposition: A | Discharge status: Home / Self Care | Source: Ambulatory Visit

## 2023-09-12 ENCOUNTER — Other Ambulatory Visit: Payer: Self-pay

## 2023-09-12 DIAGNOSIS — Z3491 Encounter for supervision of normal pregnancy, unspecified, first trimester: Secondary | ICD-10-CM | POA: Diagnosis present

## 2023-09-12 DIAGNOSIS — O0991 Supervision of high risk pregnancy, unspecified, first trimester: Secondary | ICD-10-CM | POA: Diagnosis not present

## 2023-09-12 DIAGNOSIS — O0990 Supervision of high risk pregnancy, unspecified, unspecified trimester: Secondary | ICD-10-CM

## 2023-09-12 DIAGNOSIS — Z3A08 8 weeks gestation of pregnancy: Secondary | ICD-10-CM | POA: Diagnosis not present

## 2023-09-19 ENCOUNTER — Other Ambulatory Visit: Payer: Self-pay

## 2023-09-19 ENCOUNTER — Ambulatory Visit (INDEPENDENT_AMBULATORY_CARE_PROVIDER_SITE_OTHER)

## 2023-09-19 VITALS — BP 107/75 | HR 84 | Wt 203.1 lb

## 2023-09-19 DIAGNOSIS — Z98891 History of uterine scar from previous surgery: Secondary | ICD-10-CM | POA: Insufficient documentation

## 2023-09-19 DIAGNOSIS — Z1329 Encounter for screening for other suspected endocrine disorder: Secondary | ICD-10-CM

## 2023-09-19 DIAGNOSIS — T7589XA Other specified effects of external causes, initial encounter: Secondary | ICD-10-CM

## 2023-09-19 DIAGNOSIS — O34219 Maternal care for unspecified type scar from previous cesarean delivery: Secondary | ICD-10-CM | POA: Diagnosis not present

## 2023-09-19 DIAGNOSIS — F17210 Nicotine dependence, cigarettes, uncomplicated: Secondary | ICD-10-CM

## 2023-09-19 DIAGNOSIS — Z1379 Encounter for other screening for genetic and chromosomal anomalies: Secondary | ICD-10-CM

## 2023-09-19 DIAGNOSIS — Z130 Encounter for screening for diseases of the blood and blood-forming organs and certain disorders involving the immune mechanism: Secondary | ICD-10-CM

## 2023-09-19 DIAGNOSIS — O99321 Drug use complicating pregnancy, first trimester: Secondary | ICD-10-CM | POA: Diagnosis not present

## 2023-09-19 DIAGNOSIS — O09521 Supervision of elderly multigravida, first trimester: Secondary | ICD-10-CM

## 2023-09-19 DIAGNOSIS — Z0184 Encounter for antibody response examination: Secondary | ICD-10-CM

## 2023-09-19 DIAGNOSIS — Z3689 Encounter for other specified antenatal screening: Secondary | ICD-10-CM

## 2023-09-19 DIAGNOSIS — Z0283 Encounter for blood-alcohol and blood-drug test: Secondary | ICD-10-CM

## 2023-09-19 DIAGNOSIS — O98411 Viral hepatitis complicating pregnancy, first trimester: Secondary | ICD-10-CM

## 2023-09-19 DIAGNOSIS — F1210 Cannabis abuse, uncomplicated: Secondary | ICD-10-CM | POA: Diagnosis not present

## 2023-09-19 DIAGNOSIS — Z6833 Body mass index (BMI) 33.0-33.9, adult: Secondary | ICD-10-CM

## 2023-09-19 DIAGNOSIS — O99331 Smoking (tobacco) complicating pregnancy, first trimester: Secondary | ICD-10-CM

## 2023-09-19 DIAGNOSIS — R768 Other specified abnormal immunological findings in serum: Secondary | ICD-10-CM

## 2023-09-19 DIAGNOSIS — E669 Obesity, unspecified: Secondary | ICD-10-CM

## 2023-09-19 DIAGNOSIS — O0990 Supervision of high risk pregnancy, unspecified, unspecified trimester: Secondary | ICD-10-CM

## 2023-09-19 DIAGNOSIS — Z131 Encounter for screening for diabetes mellitus: Secondary | ICD-10-CM

## 2023-09-19 DIAGNOSIS — B1920 Unspecified viral hepatitis C without hepatic coma: Secondary | ICD-10-CM

## 2023-09-19 DIAGNOSIS — Z3A08 8 weeks gestation of pregnancy: Secondary | ICD-10-CM

## 2023-09-19 NOTE — Patient Instructions (Signed)

## 2023-09-19 NOTE — Assessment & Plan Note (Addendum)
-   In 2020, patient stated she tested positive for Hep C antibody but neg for RNA. Follow up testing all Hep C neg. Hep C to be collected with NOB labs.

## 2023-09-19 NOTE — Progress Notes (Signed)
 NEW OB HISTORY AND PHYSICAL  SUBJECTIVE:       Christina Marquez is a 38 y.o. G19P2002 female, Patient's last menstrual period was 07/21/2023 (within days)., Estimated Date of Delivery: 04/26/24, [redacted]w[redacted]d, confirmed by 8 week US . presents today for establishment of Prenatal Care. This pregnancy is a surprise and unplanned but she feels like she would not emotionally be able to deal with terminating.  She reports headache and nausea.   Obsteric History -Cesarean delivery with G1 due to NRFHT - VBAC with G2 -Pregnancies otherwise uncomplicated  OB History  Gravida Para Term Preterm AB Living  3 2 2  0 0 2  SAB IAB Ectopic Multiple Live Births  0 0 0 0 2    # Outcome Date GA Lbr Len/2nd Weight Sex Type Anes PTL Lv  3 Current           2 Term 02/24/13 [redacted]w[redacted]d   F VBAC   LIV  1 Term 05/02/01 [redacted]w[redacted]d   M CS-LTranv   LIV    Social history Partner/Relationship: has only been dating for 3 mos, plans to be involved Shawn Living situation: with two kids Work: Location manager Exercise: walking at work Substance use: smokes cigarettes and marijuana, hoping to quit both   Gynecologic History Pregnancy dating reviewed:  Patient's last menstrual period was 07/21/2023 (within days). Periods have been regular.  Contraception: condoms Last Pap: 2025. Results were: normal  Other Pertinent Health History:   Past Medical History:  Diagnosis Date   Anxiety    Vaginal Pap smear, abnormal 2003   LEEP    Past Surgical History:  Procedure Laterality Date   CESAREAN SECTION  2003   LEEP  2003   wisdom teeth extraction  2024    Current Outpatient Medications on File Prior to Visit  Medication Sig Dispense Refill   Prenatal Vit-Fe Fumarate-FA (MULTIVITAMIN-PRENATAL) 27-0.8 MG TABS tablet Take 1 tablet by mouth daily at 12 noon.     albuterol  (VENTOLIN  HFA) 108 (90 Base) MCG/ACT inhaler Inhale 2 puffs into the lungs every 6 (six) hours as needed for wheezing or shortness of breath. (Patient not taking:  Reported on 09/19/2023) 8 g 2   fluticasone  (FLONASE ) 50 MCG/ACT nasal spray Place 2 sprays into both nostrils daily. (Patient not taking: Reported on 09/19/2023) 16 g 0   ibuprofen  (ADVIL ) 600 MG tablet Take 1 tablet (600 mg total) by mouth every 6 (six) hours as needed. (Patient not taking: Reported on 09/19/2023) 30 tablet 0   ipratropium (ATROVENT ) 0.06 % nasal spray Place 2 sprays into both nostrils 4 (four) times daily. (Patient not taking: Reported on 09/19/2023) 15 mL 0   nicotine  (NICODERM CQ  - DOSED IN MG/24 HOURS) 14 mg/24hr patch Place 1 patch (14 mg total) onto the skin daily. (Patient not taking: Reported on 09/19/2023) 30 patch 3   promethazine -dextromethorphan (PROMETHAZINE -DM) 6.25-15 MG/5ML syrup Take 5 mLs by mouth 4 (four) times daily as needed. (Patient not taking: Reported on 09/19/2023) 118 mL 0   Spacer/Aero-Holding Chambers (AEROCHAMBER PLUS) inhaler Use with inhaler (Patient not taking: Reported on 09/19/2023) 1 each 2   No current facility-administered medications on file prior to visit.    No Known Allergies  Social History   Socioeconomic History   Marital status: Divorced    Spouse name: Not on file   Number of children: 2   Years of education: Not on file   Highest education level: Not on file  Occupational History   Not on file  Tobacco  Use   Smoking status: Every Day    Current packs/day: 0.50    Types: Cigarettes   Smokeless tobacco: Never  Vaping Use   Vaping status: Never Used  Substance and Sexual Activity   Alcohol use: Not Currently    Alcohol/week: 26.0 standard drinks of alcohol    Types: 24 Cans of beer, 2 Shots of liquor per week    Comment: last use approx 2 mo ago   Drug use: Yes    Frequency: 7.0 times per week    Types: Marijuana    Comment: daily mj use/ counseled   Sexual activity: Yes    Partners: Male    Birth control/protection: None    Comment: last depo years ago  Other Topics Concern   Not on file  Social History Narrative    Not on file   Social Drivers of Health   Financial Resource Strain: Not on file  Food Insecurity: No Food Insecurity (08/29/2023)   Hunger Vital Sign    Worried About Running Out of Food in the Last Year: Never true    Ran Out of Food in the Last Year: Never true  Transportation Needs: No Transportation Needs (08/29/2023)   PRAPARE - Administrator, Civil Service (Medical): No    Lack of Transportation (Non-Medical): No  Physical Activity: Not on file  Stress: Not on file  Social Connections: Not on file  Intimate Partner Violence: Not At Risk (08/29/2023)   Humiliation, Afraid, Rape, and Kick questionnaire    Fear of Current or Ex-Partner: No    Emotionally Abused: No    Physically Abused: No    Sexually Abused: No    Family History  Problem Relation Age of Onset   Diabetes Mother    Emphysema Mother    Allergies Mother    Asthma Mother    Hypertension Mother    Depression Mother    Seizures Father    Emphysema Father    Depression Father    Post-traumatic stress disorder Brother    Anxiety disorder Brother    Lung cancer Maternal Grandmother    Heart attack Maternal Grandmother    Leukemia Maternal Grandfather    Asthma Daughter    Asthma Son    Breast cancer Maternal Aunt    Heart attack Maternal Aunt     The following portions of the patient's history were reviewed and updated as appropriate: allergies, current medications, past OB history, past medical history, past surgical history, past family history, past social history, and problem list.  Constitutional: Denied constitutional symptoms, night sweats, recent illness, fatigue, fever, insomnia and weight loss.  Eyes: Denied eye symptoms, eye pain, photophobia, vision change and visual disturbance.  Ears/Nose/Throat/Neck: Denied ear, nose, throat or neck symptoms, hearing loss, nasal discharge, sinus congestion and sore throat.  Cardiovascular: Denied cardiovascular symptoms, arrhythmia, chest  pain/pressure, edema, exercise intolerance, orthopnea and palpitations.  Respiratory: Denied pulmonary symptoms, asthma, pleuritic pain, productive sputum, cough, dyspnea and wheezing.  Gastrointestinal: Nausea  Genitourinary: Denied genitourinary symptoms including symptomatic vaginal discharge, pelvic relaxation issues, and urinary complaints.  Musculoskeletal: Denied musculoskeletal symptoms, stiffness, swelling, muscle weakness and myalgia.  Dermatologic: Denied dermatology symptoms, rash and scar.  Neurologic: Denied neurology symptoms, dizziness, headache, neck pain and syncope.  Psychiatric: Denied psychiatric symptoms, anxiety and depression.  Endocrine: Denied endocrine symptoms including hot flashes and night sweats.     OBJECTIVE: Initial Physical Exam (New OB)  GENERAL APPEARANCE: alert, well appearing HEAD: normocephalic, atraumatic MOUTH: mucous  membranes moist, pharynx normal without lesions THYROID : no thyromegaly or masses present BREASTS: deferred LUNGS: clear to auscultation, no wheezes, rales or rhonchi, symmetric air entry HEART: regular rate and rhythm, no murmurs ABDOMEN: soft, nontender, nondistended, no abnormal masses, no epigastric pain PELVIC EXAM deferred  ASSESSMENT/PLAN  Normal pregnancy   History of cesarean delivery - CS with first pregnancy, VBAC with second. - Desires TOLAC.  Supervision of high risk pregnancy, antepartum Routine prenatal care. We discussed an overview of prenatal care and when to call. Reviewed diet, exercise, and weight gain recommendations in pregnancy. Discussed benefits of breastfeeding and lactation resources at [facility]. I reviewed labs and answered all questions.  Marijuana abuse - Patient plans to quite in pregnancy. - UDS collected.  Cigarette smoker - Patient plans to quit in pregnancy. - Urine collected for screening.   Obesity (BMI 30-39.9) - PIH, TSH, HgbA1c, and PC ratio collected.  Hepatitis C antibody  test positive - In 2020, patient stated she tested positive for Hep C antibody but neg for RNA. Follow up testing all Hep C neg. Hep C to be collected with NOB labs.      Anatomy ultrasound ordered for 18-20 weeks.  NOB labs: will be collected in 2 weeks with genetic screening   See orders  Lauraine Range, [facility] 09/19/23 4:21 PM

## 2023-09-19 NOTE — Assessment & Plan Note (Signed)
 Routine prenatal care. We discussed an overview of prenatal care and when to call. Reviewed diet, exercise, and weight gain recommendations in pregnancy. Discussed benefits of breastfeeding and lactation resources at [facility]. I reviewed labs and answered all questions.

## 2023-09-19 NOTE — Assessment & Plan Note (Signed)
-   Patient plans to quite in pregnancy. - UDS collected.

## 2023-09-19 NOTE — Assessment & Plan Note (Signed)
-   PIH, TSH, HgbA1c, and PC ratio collected.

## 2023-09-19 NOTE — Assessment & Plan Note (Signed)
-   Patient plans to quit in pregnancy. - Urine collected for screening.

## 2023-09-19 NOTE — Assessment & Plan Note (Signed)
-   CS with first pregnancy, VBAC with second. - Desires TOLAC.

## 2023-09-20 LAB — URINALYSIS, ROUTINE W REFLEX MICROSCOPIC
Bilirubin, UA: NEGATIVE
Glucose, UA: NEGATIVE
Ketones, UA: NEGATIVE
Nitrite, UA: NEGATIVE
RBC, UA: NEGATIVE
Specific Gravity, UA: 1.028 (ref 1.005–1.030)
Urobilinogen, Ur: 1 mg/dL (ref 0.2–1.0)
pH, UA: 6 (ref 5.0–7.5)

## 2023-09-20 LAB — MICROSCOPIC EXAMINATION
Casts: NONE SEEN /LPF
Epithelial Cells (non renal): >10 /HPF — AB (ref 0–10)

## 2023-09-20 LAB — PROTEIN / CREATININE RATIO, URINE
Creatinine, Urine: 177.1 mg/dL
Protein, Ur: 11.6 mg/dL
Protein/Creat Ratio: 65 mg/g{creat} (ref 0–200)

## 2023-09-21 ENCOUNTER — Ambulatory Visit

## 2023-09-21 ENCOUNTER — Other Ambulatory Visit (HOSPITAL_COMMUNITY)
Admission: RE | Admit: 2023-09-21 | Discharge: 2023-09-21 | Disposition: A | Discharge status: Home / Self Care | Source: Ambulatory Visit | Attending: Obstetrics | Admitting: Obstetrics

## 2023-09-21 VITALS — BP 140/93 | HR 90 | Wt 199.9 lb

## 2023-09-21 DIAGNOSIS — O26891 Other specified pregnancy related conditions, first trimester: Secondary | ICD-10-CM | POA: Diagnosis present

## 2023-09-21 DIAGNOSIS — N898 Other specified noninflammatory disorders of vagina: Secondary | ICD-10-CM | POA: Insufficient documentation

## 2023-09-21 LAB — URINE CULTURE, OB REFLEX

## 2023-09-21 LAB — CULTURE, OB URINE

## 2023-09-21 NOTE — Progress Notes (Signed)
    NURSE VISIT NOTE  Subjective:    Patient ID: Denicia Pagliarulo, female    DOB: Sep 01, 1985, 38 y.o.   MRN: [id number]  HPI  Patient is a 38 y.o. G66P2002 female who presents for white and creamy vaginal discharge for 1 day(s). Denies abnormal vaginal bleeding or significant pelvic pain or fever.  Patient denies history of known exposure to STD.   Objective:    BP (!) 140/93 (BP Location: Left Arm, Patient Position: Sitting, Cuff Size: Normal)   Pulse 90   Wt 199 lb 14.4 oz (90.7 kg)   LMP 07/21/2023 (Within Days)   BMI 33.27 kg/m     Assessment:   1. Vaginal discharge during pregnancy in first trimester       Plan:   GC and chlamydia DNA  probe sent to lab. Treatment: abstain from coitus during course of treatment ROV prn if symptoms persist or worsen.   Burnard LITTIE Ro, CMA

## 2023-09-22 LAB — MONITOR DRUG PROFILE 14(MW)
Amphetamine Scrn, Ur: NEGATIVE ng/mL
BARBITURATE SCREEN URINE: NEGATIVE ng/mL
BENZODIAZEPINE SCREEN, URINE: NEGATIVE ng/mL
Buprenorphine, Urine: NEGATIVE ng/mL
Cocaine (Metab) Scrn, Ur: NEGATIVE ng/mL
Creatinine(Crt), U: 182 mg/dL (ref 20.0–300.0)
Fentanyl, Urine: NEGATIVE pg/mL
Meperidine Screen, Urine: NEGATIVE ng/mL
Methadone Screen, Urine: NEGATIVE ng/mL
OXYCODONE+OXYMORPHONE UR QL SCN: NEGATIVE ng/mL
Opiate Scrn, Ur: NEGATIVE ng/mL
Ph of Urine: 5.5 (ref 4.5–8.9)
Phencyclidine Qn, Ur: NEGATIVE ng/mL
Propoxyphene Scrn, Ur: NEGATIVE ng/mL
SPECIFIC GRAVITY: 1.024
Tramadol Screen, Urine: NEGATIVE ng/mL

## 2023-09-22 LAB — CERVICOVAGINAL ANCILLARY ONLY
Bacterial Vaginitis (gardnerella): POSITIVE — AB
Candida Glabrata: NEGATIVE
Candida Vaginitis: NEGATIVE
Chlamydia: NEGATIVE
Comment: NEGATIVE
Comment: NEGATIVE
Comment: NEGATIVE
Comment: NEGATIVE
Comment: NEGATIVE
Comment: NORMAL
Neisseria Gonorrhea: NEGATIVE
Trichomonas: NEGATIVE

## 2023-09-22 LAB — NICOTINE SCREEN, URINE: Cotinine Ql Scrn, Ur: POSITIVE ng/mL — AB

## 2023-09-22 LAB — CANNABINOID (GC/MS), URINE
Cannabinoid: POSITIVE — AB
Carboxy THC (GC/MS): >750 ng/mL

## 2023-09-23 ENCOUNTER — Other Ambulatory Visit: Payer: Self-pay

## 2023-09-23 ENCOUNTER — Telehealth: Payer: Self-pay

## 2023-09-23 ENCOUNTER — Ambulatory Visit: Payer: Self-pay

## 2023-09-23 DIAGNOSIS — B9689 Other specified bacterial agents as the cause of diseases classified elsewhere: Secondary | ICD-10-CM

## 2023-09-23 MED ORDER — METRONIDAZOLE 500 MG PO TABS: 500.0000 mg | ORAL_TABLET | Freq: Two times a day (BID) | ORAL | 0 refills | Status: DC

## 2023-09-23 MED ORDER — METRONIDAZOLE 500 MG PO TABS
500.0000 mg | ORAL_TABLET | Freq: Two times a day (BID) | ORAL | 0 refills | Status: DC
Start: 2023-09-23 — End: 2023-11-17

## 2023-09-23 NOTE — Telephone Encounter (Signed)
 Called pt to advise of results. No answer lmtrc. Metronidazole  sent in  to [facility]

## 2023-10-03 ENCOUNTER — Other Ambulatory Visit

## 2023-10-03 DIAGNOSIS — Z3A08 8 weeks gestation of pregnancy: Secondary | ICD-10-CM

## 2023-10-03 DIAGNOSIS — T7589XA Other specified effects of external causes, initial encounter: Secondary | ICD-10-CM

## 2023-10-03 DIAGNOSIS — Z0184 Encounter for antibody response examination: Secondary | ICD-10-CM

## 2023-10-03 DIAGNOSIS — Z131 Encounter for screening for diabetes mellitus: Secondary | ICD-10-CM

## 2023-10-03 DIAGNOSIS — Z6833 Body mass index (BMI) 33.0-33.9, adult: Secondary | ICD-10-CM

## 2023-10-03 DIAGNOSIS — Z1379 Encounter for other screening for genetic and chromosomal anomalies: Secondary | ICD-10-CM

## 2023-10-03 DIAGNOSIS — Z1329 Encounter for screening for other suspected endocrine disorder: Secondary | ICD-10-CM

## 2023-10-03 DIAGNOSIS — O09521 Supervision of elderly multigravida, first trimester: Secondary | ICD-10-CM

## 2023-10-03 DIAGNOSIS — Z130 Encounter for screening for diseases of the blood and blood-forming organs and certain disorders involving the immune mechanism: Secondary | ICD-10-CM

## 2023-10-05 LAB — CBC/D/PLT+RPR+RH+ABO+RUBIGG...
Antibody Screen: NEGATIVE
Basophils Absolute: 0.1 x10E3/uL (ref 0.0–0.2)
Basos: 1 %
EOS (ABSOLUTE): 0.1 x10E3/uL (ref 0.0–0.4)
Eos: 1 %
HCV Ab: NONREACTIVE
HIV Screen 4th Generation wRfx: NONREACTIVE
Hematocrit: 45.9 % (ref 34.0–46.6)
Hemoglobin: 14.8 g/dL (ref 11.1–15.9)
Hepatitis B Surface Ag: NEGATIVE
Immature Grans (Abs): 0.1 x10E3/uL (ref 0.0–0.1)
Immature Granulocytes: 1 %
Lymphocytes Absolute: 2.2 x10E3/uL (ref 0.7–3.1)
Lymphs: 20 %
MCH: 29 pg (ref 26.6–33.0)
MCHC: 32.2 g/dL (ref 31.5–35.7)
MCV: 90 fL (ref 79–97)
Monocytes Absolute: 0.9 x10E3/uL (ref 0.1–0.9)
Monocytes: 8 %
Neutrophils Absolute: 7.3 x10E3/uL — ABNORMAL HIGH (ref 1.4–7.0)
Neutrophils: 69 %
Platelets: 294 x10E3/uL (ref 150–450)
RBC: 5.11 x10E6/uL (ref 3.77–5.28)
RDW: 13.4 % (ref 11.7–15.4)
RPR Ser Ql: NONREACTIVE
Rh Factor: POSITIVE
Rubella Antibodies, IGG: 15.2 {index} (ref >0.99)
Varicella zoster IgG: REACTIVE
WBC: 10.6 x10E3/uL (ref 3.4–10.8)

## 2023-10-05 LAB — COMPREHENSIVE METABOLIC PANEL WITH GFR
ALT: 19 IU/L (ref 0–32)
AST: 20 IU/L (ref 0–40)
Albumin: 4 g/dL (ref 3.9–4.9)
Alkaline Phosphatase: 72 IU/L (ref 44–121)
BUN/Creatinine Ratio: 14 (ref 9–23)
BUN: 9 mg/dL (ref 6–24)
Bilirubin Total: 0.2 mg/dL (ref 0.0–1.2)
CO2: 18 mmol/L — ABNORMAL LOW (ref 20–29)
Calcium: 9.2 mg/dL (ref 8.7–10.2)
Chloride: 103 mmol/L (ref 96–106)
Creatinine, Ser: 0.63 mg/dL (ref 0.57–1.00)
Globulin, Total: 2.3 g/dL (ref 1.5–4.5)
Glucose: 87 mg/dL (ref 70–99)
Potassium: 4.1 mmol/L (ref 3.5–5.2)
Sodium: 137 mmol/L (ref 134–144)
Total Protein: 6.3 g/dL (ref 6.0–8.5)
eGFR: 115 mL/min/1.73 (ref >59)

## 2023-10-05 LAB — HCV INTERPRETATION

## 2023-10-05 LAB — TSH+FREE T4
Free T4: 1.17 ng/dL (ref 0.82–1.77)
TSH: 1.68 u[IU]/mL (ref 0.450–4.500)

## 2023-10-05 LAB — HEMOGLOBIN A1C
Est. average glucose Bld gHb Est-mCnc: 108 mg/dL
Hgb A1c MFr Bld: 5.4 % (ref 4.8–5.6)

## 2023-10-08 LAB — MATERNIT 21 PLUS CORE, BLOOD
Fetal Fraction: 8
Result (T21): NEGATIVE
Trisomy 13 (Patau syndrome): NEGATIVE
Trisomy 18 (Edwards syndrome): NEGATIVE
Trisomy 21 (Down syndrome): NEGATIVE

## 2023-10-17 ENCOUNTER — Encounter: Payer: Self-pay | Admitting: Obstetrics

## 2023-10-17 ENCOUNTER — Ambulatory Visit: Admitting: Obstetrics

## 2023-10-17 VITALS — BP 118/79 | HR 75 | Wt 198.7 lb

## 2023-10-17 DIAGNOSIS — Z3A12 12 weeks gestation of pregnancy: Secondary | ICD-10-CM | POA: Diagnosis not present

## 2023-10-17 DIAGNOSIS — O0991 Supervision of high risk pregnancy, unspecified, first trimester: Secondary | ICD-10-CM

## 2023-10-17 DIAGNOSIS — O0990 Supervision of high risk pregnancy, unspecified, unspecified trimester: Secondary | ICD-10-CM

## 2023-10-17 DIAGNOSIS — E669 Obesity, unspecified: Secondary | ICD-10-CM

## 2023-10-17 MED ORDER — NICOTINE POLACRILEX 2 MG MT LOZG: 2.0000 mg | LOZENGE | OROMUCOSAL | 0 refills | Status: AC | PRN

## 2023-10-17 MED ORDER — NATACHEW 28-1 MG PO CHEW: 1.0000 | CHEWABLE_TABLET | Freq: Every day | ORAL | 6 refills | Status: DC

## 2023-10-17 MED ORDER — ASPIRIN 81 MG PO TBEC: 81.0000 mg | DELAYED_RELEASE_TABLET | Freq: Every day | ORAL | 2 refills | Status: DC

## 2023-10-17 NOTE — Progress Notes (Signed)
    Return Prenatal Note   Assessment/Plan   Plan  38 y.o. G3P2002 at [redacted]w[redacted]d presents for follow-up OB visit. Reviewed prenatal record including previous visit note.  Supervision of high risk pregnancy, antepartum -Anticipatory guidance about 2nd trimester -Rx for chewable prenatal, LDASA, and nicotine  lozenge sent -Anatomy US  ordered for 20 weeks -Discussed 3rd trimester antenatal surveillance d/t AMA   No orders of the defined types were placed in this encounter.  Return in about 4 weeks (around 11/14/2023).   Future Appointments  Date Time Provider Department Center  11/14/2023  8:55 AM Dominic, Jinnie Jansky, CNM AOB-AOB None    For next visit:  Routine prenatal care    Subjective  Christina Marquez is still feeling nauseated. She has good support at work from her coworkers Doctor, general practice). She is working on reducing her tobacco use and would like to try nicotine  lozenges.  Movement: Absent Contractions: Not present  Objective   Flow sheet Vitals: Pulse Rate: 75 BP: 118/79 Fetal Heart Rate (bpm): 158 Total weight gain: 11.2 oz (0.318 kg)  General Appearance  No acute distress, well appearing, and well nourished Pulmonary   Normal work of breathing Neurologic   Alert and oriented to person, place, and time Psychiatric   Mood and affect within normal limits  Eleanor Canny, CNM 10/17/23 1:44 PM

## 2023-10-17 NOTE — Assessment & Plan Note (Addendum)
-  Anticipatory guidance about 2nd trimester -Rx for chewable prenatal, LDASA, and nicotine  lozenge sent -Anatomy US  ordered for 20 weeks -Discussed 3rd trimester antenatal surveillance d/t AMA

## 2023-10-19 ENCOUNTER — Other Ambulatory Visit: Payer: Self-pay

## 2023-10-19 ENCOUNTER — Telehealth: Payer: Self-pay

## 2023-10-19 NOTE — Telephone Encounter (Signed)
 Pt calling triage with Rx Prenatal Vit-Fe Fum-Fe Bisg-FA (NATACHEW ) 28-1 MG CHEW issues. Was advised by her pharmacy her pregnancy medicaid kicks in 10/28/23 and if she wants prenatals she has to pay $600. Advised her to get OTC prenatal vitamins that have 400-800 mcg folic acid or Flintstone gummies. She can try getting Rx once her medicaid kicks in.

## 2023-11-14 ENCOUNTER — Encounter: Payer: Self-pay | Admitting: Licensed Practical Nurse

## 2023-11-14 ENCOUNTER — Ambulatory Visit (INDEPENDENT_AMBULATORY_CARE_PROVIDER_SITE_OTHER): Admitting: Licensed Practical Nurse

## 2023-11-14 VITALS — BP 120/82 | HR 76 | Wt 203.7 lb

## 2023-11-14 DIAGNOSIS — Z3A17 17 weeks gestation of pregnancy: Secondary | ICD-10-CM

## 2023-11-14 DIAGNOSIS — F1210 Cannabis abuse, uncomplicated: Secondary | ICD-10-CM

## 2023-11-14 DIAGNOSIS — O0990 Supervision of high risk pregnancy, unspecified, unspecified trimester: Secondary | ICD-10-CM | POA: Diagnosis not present

## 2023-11-14 DIAGNOSIS — Z1379 Encounter for other screening for genetic and chromosomal anomalies: Secondary | ICD-10-CM | POA: Diagnosis not present

## 2023-11-14 DIAGNOSIS — F17210 Nicotine dependence, cigarettes, uncomplicated: Secondary | ICD-10-CM

## 2023-11-14 DIAGNOSIS — Z3A16 16 weeks gestation of pregnancy: Secondary | ICD-10-CM | POA: Diagnosis not present

## 2023-11-16 LAB — AFP, SERUM, OPEN SPINA BIFIDA
AFP MoM: 1.75
AFP Value: 48.1 ng/mL
Gest. Age on Collection Date: 16 wk
Maternal Age At EDD: 40.9 a
OSBR Risk 1 IN: 1442
Test Results:: NEGATIVE
Weight: 203 [lb_av]

## 2023-11-17 NOTE — Assessment & Plan Note (Signed)
Stopped using 

## 2023-11-17 NOTE — Assessment & Plan Note (Signed)
-  Using patch, seems to help. Occasionally smoking a cigarette

## 2023-11-17 NOTE — Assessment & Plan Note (Addendum)
-  anatomy US  9/2 -taking baby ASA  -AFP collected -TWG 5lbs, WNL -warning signs reviewed

## 2023-11-17 NOTE — Progress Notes (Signed)
    Return Prenatal Note   Subjective   38 y.o. H6E7997 at [redacted]w[redacted]d presents for this follow-up prenatal visit.  Patient  Patient reports:Doing ok, has pain in her Leg buttock/leg, ok to try chiro. Omie has been so-so, has seen Alan at the [facility] in the past, has a good support system.   Movement: Absent Contractions: Not present  Objective   Flow sheet Vitals: Pulse Rate: 76 BP: 120/82 Fetal Heart Rate (bpm): 150 Total weight gain: 5 lb 11.2 oz (2.586 kg)  General Appearance  No acute distress, well appearing, and well nourished Pulmonary   Normal work of breathing Neurologic   Alert and oriented to person, place, and time Psychiatric   Mood and affect within normal limits  Assessment/Plan   Plan  38 y.o. H6E7997 at [redacted]w[redacted]d presents for follow-up OB visit. Reviewed prenatal record including previous visit note.  Supervision of high risk pregnancy, antepartum -anatomy US  9/2 -taking baby ASA  -AFP collected -TWG 5lbs, WNL -warning signs reviewed    Marijuana abuse -Stopped using   Cigarette smoker -Using patch, seems to help. Occasionally smoking a cigarette      Orders Placed This Encounter  Procedures   AFP, Serum, Open Spina Bifida    Is patient insulin dependent?:   No    Weight (lbs):   203    Gestational Age (GA), weeks:   9    Date on which patient was at this GA:   11/14/2023    GA Calculation Method:   LMP    Number of fetuses:   1    Donor egg?:   N    Release to patient:   Immediate [1]   Return in about 4 weeks (around 12/12/2023) for ROB, NEEDS anatomy US  scheduled for 2 weeks.   Future Appointments  Date Time Provider Department Center  11/29/2023 11:00 AM [facility]US[facility]  [facility]  12/12/2023  8:55 AM Slaughterbeck, Damien, CNM AOB-A[facility] None    For next visit:  continue with routine prenatal care     Christina Marquez [facility], CNM  08/21/253:43 PM

## 2023-11-20 ENCOUNTER — Other Ambulatory Visit: Payer: Self-pay

## 2023-11-20 ENCOUNTER — Emergency Department
Admission: EM | Admit: 2023-11-20 | Discharge: 2023-11-20 | Discharge status: Against Medical Advice | Attending: Emergency Medicine | Admitting: Emergency Medicine

## 2023-11-20 DIAGNOSIS — Z5321 Procedure and treatment not carried out due to patient leaving prior to being seen by health care provider: Secondary | ICD-10-CM | POA: Diagnosis not present

## 2023-11-20 DIAGNOSIS — R42 Dizziness and giddiness: Secondary | ICD-10-CM | POA: Insufficient documentation

## 2023-11-20 LAB — COMPREHENSIVE METABOLIC PANEL WITH GFR
ALT: 34 U/L (ref 0–44)
AST: 24 U/L (ref 15–41)
Albumin: 3.4 g/dL — ABNORMAL LOW (ref 3.5–5.0)
Alkaline Phosphatase: 70 U/L (ref 38–126)
Anion gap: 8 (ref 5–15)
BUN: 15 mg/dL (ref 6–20)
CO2: 19 mmol/L — ABNORMAL LOW (ref 22–32)
Calcium: 9 mg/dL (ref 8.9–10.3)
Chloride: 108 mmol/L (ref 98–111)
Creatinine, Ser: 0.47 mg/dL (ref 0.44–1.00)
GFR, Estimated: >60 mL/min (ref >60)
Glucose, Bld: 89 mg/dL (ref 70–99)
Potassium: 3.6 mmol/L (ref 3.5–5.1)
Sodium: 135 mmol/L (ref 135–145)
Total Bilirubin: 0.4 mg/dL (ref 0.0–1.2)
Total Protein: 6.6 g/dL (ref 6.5–8.1)

## 2023-11-20 LAB — CBC
HCT: 36.5 % (ref 36.0–46.0)
Hemoglobin: 12.1 g/dL (ref 12.0–15.0)
MCH: 28.9 pg (ref 26.0–34.0)
MCHC: 33.2 g/dL (ref 30.0–36.0)
MCV: 87.1 fL (ref 80.0–100.0)
Platelets: 274 K/uL (ref 150–400)
RBC: 4.19 MIL/uL (ref 3.87–5.11)
RDW: 14.6 % (ref 11.5–15.5)
WBC: 12.8 K/uL — ABNORMAL HIGH (ref 4.0–10.5)
nRBC: 0 % (ref 0.0–0.2)

## 2023-11-20 NOTE — ED Triage Notes (Signed)
 Pt comes with co dizziness that started while at work. Pt is [redacted] weeks pregnant . Pt unsure if it got too hot. Pt states lightheaded and foggy.

## 2023-11-29 ENCOUNTER — Ambulatory Visit
Admission: RE | Admit: 2023-11-29 | Discharge: 2023-11-29 | Disposition: A | Discharge status: Home / Self Care | Source: Ambulatory Visit

## 2023-11-29 DIAGNOSIS — O09521 Supervision of elderly multigravida, first trimester: Secondary | ICD-10-CM | POA: Insufficient documentation

## 2023-11-29 DIAGNOSIS — Z3689 Encounter for other specified antenatal screening: Secondary | ICD-10-CM | POA: Diagnosis present

## 2023-12-08 NOTE — Progress Notes (Incomplete)
    Return Prenatal Note   Subjective   38 y.o. H6E7997 at [redacted]w[redacted]d presents for this follow-up prenatal visit.  Patient is doing well. She has no new concerns today. Patient reports: Movement: Present Contractions: Not present  Objective   Flow sheet Vitals: Pulse Rate: 98 BP: 118/63 Total weight gain: 14 lb 12.8 oz (6.713 kg)  General Appearance  No acute distress, well appearing, and well nourished Pulmonary   Normal work of breathing Neurologic   Alert and oriented to person, place, and time Psychiatric   Mood and affect within normal limits   Assessment/Plan   Plan  38 y.o. H6E7997 at [redacted]w[redacted]d presents for follow-up OB visit. Reviewed prenatal record including previous visit note.  No problem-specific Assessment & Plan notes found for this encounter.      No orders of the defined types were placed in this encounter.  No follow-ups on file.   No future appointments.   For next visit:  continue with routine prenatal care     Damien Parsley, CNM [facility]

## 2023-12-12 ENCOUNTER — Ambulatory Visit (INDEPENDENT_AMBULATORY_CARE_PROVIDER_SITE_OTHER): Admitting: Certified Nurse Midwife

## 2023-12-12 ENCOUNTER — Other Ambulatory Visit (HOSPITAL_COMMUNITY)
Admission: RE | Admit: 2023-12-12 | Discharge: 2023-12-12 | Disposition: A | Discharge status: Home / Self Care | Source: Ambulatory Visit | Attending: Certified Nurse Midwife | Admitting: Certified Nurse Midwife

## 2023-12-12 VITALS — BP 118/63 | HR 98 | Wt 212.8 lb

## 2023-12-12 DIAGNOSIS — O0992 Supervision of high risk pregnancy, unspecified, second trimester: Secondary | ICD-10-CM | POA: Diagnosis not present

## 2023-12-12 DIAGNOSIS — Z113 Encounter for screening for infections with a predominantly sexual mode of transmission: Secondary | ICD-10-CM

## 2023-12-12 DIAGNOSIS — Z3A20 20 weeks gestation of pregnancy: Secondary | ICD-10-CM

## 2023-12-12 DIAGNOSIS — O0990 Supervision of high risk pregnancy, unspecified, unspecified trimester: Secondary | ICD-10-CM

## 2023-12-12 NOTE — Assessment & Plan Note (Signed)
 Feeling very overwhelmed with recent discovery of unfaithful partner. She is also continuing to navigate her moms cancer diagnosis and treatment. Provided emotional support. Confirms she is safe from thoughts of SI. Vaginal swabs collected today. Will plan for repeat RPR/HIV at 28 weeks. Declines medication at this time.  Reviewed normal anatomy US . Reviewed red flag warning signs anticipatory guidance for upcoming prenatal care.

## 2023-12-12 NOTE — Progress Notes (Deleted)
    Return Prenatal Note   Subjective   38 y.o. H6E7997 at [redacted]w[redacted]d presents for this follow-up prenatal visit.  Patient is doing well. She has no new concerns today. Patient reports: Movement: Present Contractions: Not present  Objective   Flow sheet Vitals: Pulse Rate: 98 BP: 118/63 Total weight gain: 14 lb 12.8 oz (6.713 kg)  General Appearance  No acute distress, well appearing, and well nourished Pulmonary   Normal work of breathing Neurologic   Alert and oriented to person, place, and time Psychiatric   Mood and affect within normal limits   Assessment/Plan   Plan  38 y.o. H6E7997 at [redacted]w[redacted]d presents for follow-up OB visit. Reviewed prenatal record including previous visit note.  No problem-specific Assessment & Plan notes found for this encounter.      No orders of the defined types were placed in this encounter.  No follow-ups on file.   Future Appointments  Date Time Provider Department Center  01/09/2024  8:55 AM Kraig Genis, Damien, CNM AOB-AOB None     For next visit:  continue with routine prenatal care     Damien Parsley, CNM [facility]

## 2023-12-12 NOTE — Progress Notes (Unsigned)
    Return Prenatal Note   Subjective   38 y.o. H6E7997 at [redacted]w[redacted]d presents for this follow-up prenatal visit.  Patient is doing well. She has no new concerns today. Patient reports: Movement: Present Contractions: Not present  Objective   Flow sheet Vitals: Pulse Rate: 98 BP: 118/63 Fundal Height: 20 cm Fetal Heart Rate (bpm): 150 Total weight gain: 14 lb 12.8 oz (6.713 kg)  General Appearance  No acute distress, well appearing, and well nourished Pulmonary   Normal work of breathing Neurologic   Alert and oriented to person, place, and time Psychiatric   Mood and affect within normal limits   Assessment/Plan   Plan  38 y.o. H6E7997 at [redacted]w[redacted]d presents for follow-up OB visit. Reviewed prenatal record including previous visit note.  Supervision of high risk pregnancy, antepartum Feeling very overwhelmed with recent discovery of unfaithful partner. She is also continuing to navigate her moms cancer diagnosis and treatment. Provided emotional support. Confirms she is safe from thoughts of SI. Vaginal swabs collected today. Will plan for repeat RPR/HIV at 28 weeks. Declines medication at this time.  Reviewed normal anatomy US . Reviewed red flag warning signs anticipatory guidance for upcoming prenatal care.      Future Appointments  Date Time Provider Department Center  01/09/2024  8:55 AM Burr Soffer, Damien, CNM AOB-A[facility] None     For next visit:  continue with routine prenatal care     Damien Parsley, CNM [facility]

## 2023-12-13 ENCOUNTER — Ambulatory Visit: Payer: Self-pay | Admitting: Certified Nurse Midwife

## 2023-12-13 LAB — CERVICOVAGINAL ANCILLARY ONLY
Bacterial Vaginitis (gardnerella): NEGATIVE
Candida Glabrata: NEGATIVE
Candida Vaginitis: POSITIVE — AB
Chlamydia: NEGATIVE
Comment: NEGATIVE
Comment: NEGATIVE
Comment: NEGATIVE
Comment: NEGATIVE
Comment: NEGATIVE
Comment: NORMAL
Neisseria Gonorrhea: NEGATIVE
Trichomonas: NEGATIVE

## 2024-01-05 NOTE — Progress Notes (Signed)
    Return Prenatal Note   Subjective   38 y.o. H6E7997 at [redacted]w[redacted]d presents for this follow-up prenatal visit.  Patient is doing well. She has good fetal movement. She declined her flu vaccine. She notes a rash that is very itchy on both of her breast. Patient reports: Movement: Present Contractions: Not present  Objective   Flow sheet Vitals: Pulse Rate: 75 BP: 112/61 Fundal Height: 24 cm Fetal Heart Rate (bpm): 156 Total weight gain: -26 lb 4.8 oz (-11.9 kg)  General Appearance  No acute distress, well appearing, and well nourished Pulmonary   Normal work of breathing Neurologic   Alert and oriented to person, place, and time Psychiatric   Mood and affect within normal limits   Assessment/Plan   Plan  38 y.o. H6E7997 at [redacted]w[redacted]d presents for follow-up OB visit. Reviewed prenatal record including previous visit note.  Supervision of high risk pregnancy, antepartum Reviewed comfort measures for itchy skin on breasts. Very active baby.  Reviewed 28 week labs next visit.  Reviewed red flag warning signs anticipatory guidance for upcoming prenatal care.        Orders Placed This Encounter  Procedures   Tdap vaccine greater than or equal to 7yo IM    Standing Status:   Future    Expected Date:   02/02/2024    Expiration Date:   05/02/2024   28 Week RH+Panel    Standing Status:   Future    Expected Date:   02/02/2024    Expiration Date:   05/02/2024   No follow-ups on file.   Future Appointments  Date Time Provider Department Center  02/06/2024  8:55 AM Dominic, Jinnie Jansky, CNM AOB-AOB None     For next visit:  continue with routine prenatal care     Damien Parsley, CNM [facility] [place] 10/13/259:10 AM

## 2024-01-09 ENCOUNTER — Encounter: Payer: Self-pay | Admitting: Certified Nurse Midwife

## 2024-01-09 ENCOUNTER — Ambulatory Visit: Admitting: Certified Nurse Midwife

## 2024-01-09 VITALS — BP 112/61 | HR 75 | Wt 171.7 lb

## 2024-01-09 DIAGNOSIS — O0990 Supervision of high risk pregnancy, unspecified, unspecified trimester: Secondary | ICD-10-CM

## 2024-01-09 DIAGNOSIS — Z3482 Encounter for supervision of other normal pregnancy, second trimester: Secondary | ICD-10-CM

## 2024-01-09 DIAGNOSIS — Z131 Encounter for screening for diabetes mellitus: Secondary | ICD-10-CM

## 2024-01-09 DIAGNOSIS — Z114 Encounter for screening for human immunodeficiency virus [HIV]: Secondary | ICD-10-CM

## 2024-01-09 DIAGNOSIS — Z130 Encounter for screening for diseases of the blood and blood-forming organs and certain disorders involving the immune mechanism: Secondary | ICD-10-CM

## 2024-01-09 DIAGNOSIS — Z113 Encounter for screening for infections with a predominantly sexual mode of transmission: Secondary | ICD-10-CM

## 2024-01-09 DIAGNOSIS — Z3A24 24 weeks gestation of pregnancy: Secondary | ICD-10-CM

## 2024-01-09 NOTE — Assessment & Plan Note (Signed)
 Reviewed comfort measures for itchy skin on breasts. Very active baby.  Reviewed 28 week labs next visit.  Reviewed red flag warning signs anticipatory guidance for upcoming prenatal care.

## 2024-02-03 NOTE — Progress Notes (Signed)
    Return Prenatal Note   Subjective   38 y.o. H6E7997 at [redacted]w[redacted]d presents for this follow-up prenatal visit.  Patient  Patient reports: doing ok. Has had a cough and some nasal congestion. Her children have the same symptoms. Comfort measure reviewed  Does a of walking at work, gets a sharp pain in her lower abdomen which makes her need to sit down, she does not have a support belt, needs a letter for work to allow her to sit when needed. Encouraged use of belt, letter given   Movement: Present Contractions: Not present  Objective   Flow sheet Vitals: Pulse Rate: 80 BP: 119/82 Fundal Height: 30 cm Fetal Heart Rate (bpm): 140 Total weight gain: 24 lb 1.6 oz (10.9 kg)  General Appearance  No acute distress, well appearing, and well nourished Pulmonary   Normal work of breathing Neurologic   Alert and oriented to person, place, and time Psychiatric   Mood and affect within normal limits   Assessment/Plan   Plan  38 y.o. H6E7997 at [redacted]w[redacted]d presents for follow-up OB visit. Reviewed prenatal record including previous visit note.  Supervision of high risk pregnancy, antepartum -Desires BTL-consent given to review, will see MD to discuss -Plans to formula feed -Currently looking for a pediatrician  -TWG 24lbs, gained a lot in her first pregnancy, reports she eats 2 biscuits and gravy every morning. She walks a lot oat work  -28wk labs today  -TDAP given   Cigarette smoker -smoking 2-3 cigarettes a day, only at work d/t the stress  -Stopped THC       Orders Placed This Encounter  Procedures   US  OB Follow Up    Standing Status:   Future    Expected Date:   02/20/2024    Expiration Date:   02/05/2025    Reason for exam::   high BMI    Preferred imaging location?:   Internal   Tdap vaccine greater than or equal to 7yo IM   28 Week RH+Panel   Return in about 2 weeks (around 02/20/2024) for ROB with MD to discuss BTL.   Future Appointments  Date Time Provider  Department Center  02/21/2024  2:00 PM AOB-AOB US  1 AOB-IMG None  02/21/2024  3:35 PM Dove, Myra C, MD AOB-AOB None    For next visit:  continue with routine prenatal care     [place] [facility], CNM  02/06/2511:40 PM

## 2024-02-06 ENCOUNTER — Ambulatory Visit (INDEPENDENT_AMBULATORY_CARE_PROVIDER_SITE_OTHER): Admitting: Licensed Practical Nurse

## 2024-02-06 ENCOUNTER — Encounter: Payer: Self-pay | Admitting: Licensed Practical Nurse

## 2024-02-06 ENCOUNTER — Other Ambulatory Visit

## 2024-02-06 VITALS — BP 119/82 | HR 80 | Wt 222.1 lb

## 2024-02-06 DIAGNOSIS — Z3482 Encounter for supervision of other normal pregnancy, second trimester: Secondary | ICD-10-CM

## 2024-02-06 DIAGNOSIS — O99333 Smoking (tobacco) complicating pregnancy, third trimester: Secondary | ICD-10-CM | POA: Diagnosis not present

## 2024-02-06 DIAGNOSIS — Z3A28 28 weeks gestation of pregnancy: Secondary | ICD-10-CM

## 2024-02-06 DIAGNOSIS — Z23 Encounter for immunization: Secondary | ICD-10-CM | POA: Diagnosis not present

## 2024-02-06 DIAGNOSIS — Z114 Encounter for screening for human immunodeficiency virus [HIV]: Secondary | ICD-10-CM

## 2024-02-06 DIAGNOSIS — Z131 Encounter for screening for diabetes mellitus: Secondary | ICD-10-CM

## 2024-02-06 DIAGNOSIS — Z113 Encounter for screening for infections with a predominantly sexual mode of transmission: Secondary | ICD-10-CM

## 2024-02-06 DIAGNOSIS — O0990 Supervision of high risk pregnancy, unspecified, unspecified trimester: Secondary | ICD-10-CM

## 2024-02-06 DIAGNOSIS — Z130 Encounter for screening for diseases of the blood and blood-forming organs and certain disorders involving the immune mechanism: Secondary | ICD-10-CM

## 2024-02-06 DIAGNOSIS — O99210 Obesity complicating pregnancy, unspecified trimester: Secondary | ICD-10-CM

## 2024-02-06 DIAGNOSIS — F17210 Nicotine dependence, cigarettes, uncomplicated: Secondary | ICD-10-CM

## 2024-02-06 DIAGNOSIS — Z3483 Encounter for supervision of other normal pregnancy, third trimester: Secondary | ICD-10-CM

## 2024-02-06 NOTE — Assessment & Plan Note (Signed)
-  Desires BTL-consent given to review, will see MD to discuss -Plans to formula feed -Currently looking for a pediatrician  -TWG 24lbs, gained a lot in her first pregnancy, reports she eats 2 biscuits and gravy every morning. She walks a lot oat work  -28wk labs today  -TDAP given

## 2024-02-06 NOTE — Assessment & Plan Note (Signed)
-  smoking 2-3 cigarettes a day, only at work d/t the stress  -Stopped THC

## 2024-02-07 LAB — 28 WEEK RH+PANEL
Basophils Absolute: 0.1 x10E3/uL (ref 0.0–0.2)
Basos: 1 %
EOS (ABSOLUTE): 0.3 x10E3/uL (ref 0.0–0.4)
Eos: 2 %
Gestational Diabetes Screen: 103 mg/dL (ref 70–139)
HIV Screen 4th Generation wRfx: NONREACTIVE
Hematocrit: 37.4 % (ref 34.0–46.6)
Hemoglobin: 12.1 g/dL (ref 11.1–15.9)
Immature Grans (Abs): 0.1 x10E3/uL (ref 0.0–0.1)
Immature Granulocytes: 1 %
Lymphocytes Absolute: 1.9 x10E3/uL (ref 0.7–3.1)
Lymphs: 13 %
MCH: 29.8 pg (ref 26.6–33.0)
MCHC: 32.4 g/dL (ref 31.5–35.7)
MCV: 92 fL (ref 79–97)
Monocytes Absolute: 0.8 x10E3/uL (ref 0.1–0.9)
Monocytes: 6 %
Neutrophils Absolute: 11.6 x10E3/uL — ABNORMAL HIGH (ref 1.4–7.0)
Neutrophils: 77 %
Platelets: 294 x10E3/uL (ref 150–450)
RBC: 4.06 x10E6/uL (ref 3.77–5.28)
RDW: 13.5 % (ref 11.7–15.4)
RPR Ser Ql: NONREACTIVE
WBC: 14.8 x10E3/uL — ABNORMAL HIGH (ref 3.4–10.8)

## 2024-02-09 ENCOUNTER — Telehealth: Payer: Self-pay

## 2024-02-09 NOTE — Telephone Encounter (Signed)
 Patient calling triage with concerns of a sinus infection, nasty mucus when blowing her nose and she said she can smell it in her mucus. She's pretty sure she has a sinus infection, also has a cough and is wondering if we can call something in? Reached out to [place] and she is advising for patient to see her PCP or an urgent care to be evaluated and treated if needed. Patient aware.

## 2024-02-21 ENCOUNTER — Encounter: Payer: Self-pay | Admitting: Obstetrics & Gynecology

## 2024-02-21 ENCOUNTER — Ambulatory Visit

## 2024-02-21 ENCOUNTER — Ambulatory Visit (INDEPENDENT_AMBULATORY_CARE_PROVIDER_SITE_OTHER): Admitting: Obstetrics & Gynecology

## 2024-02-21 VITALS — BP 97/77 | HR 89 | Wt 222.0 lb

## 2024-02-21 DIAGNOSIS — Z98891 History of uterine scar from previous surgery: Secondary | ICD-10-CM | POA: Diagnosis not present

## 2024-02-21 DIAGNOSIS — Z362 Encounter for other antenatal screening follow-up: Secondary | ICD-10-CM | POA: Diagnosis not present

## 2024-02-21 DIAGNOSIS — O0990 Supervision of high risk pregnancy, unspecified, unspecified trimester: Secondary | ICD-10-CM

## 2024-02-21 DIAGNOSIS — Z3483 Encounter for supervision of other normal pregnancy, third trimester: Secondary | ICD-10-CM

## 2024-02-21 DIAGNOSIS — R7689 Other specified abnormal immunological findings in serum: Secondary | ICD-10-CM | POA: Diagnosis not present

## 2024-02-21 DIAGNOSIS — O99213 Obesity complicating pregnancy, third trimester: Secondary | ICD-10-CM | POA: Diagnosis not present

## 2024-02-21 DIAGNOSIS — E669 Obesity, unspecified: Secondary | ICD-10-CM | POA: Diagnosis not present

## 2024-02-21 DIAGNOSIS — Z3009 Encounter for other general counseling and advice on contraception: Secondary | ICD-10-CM

## 2024-02-21 DIAGNOSIS — Z3A31 31 weeks gestation of pregnancy: Secondary | ICD-10-CM | POA: Diagnosis not present

## 2024-02-21 DIAGNOSIS — Z3A28 28 weeks gestation of pregnancy: Secondary | ICD-10-CM

## 2024-02-21 DIAGNOSIS — O99210 Obesity complicating pregnancy, unspecified trimester: Secondary | ICD-10-CM

## 2024-02-21 DIAGNOSIS — Z3A30 30 weeks gestation of pregnancy: Secondary | ICD-10-CM
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
## 2024-02-23 ENCOUNTER — Observation Stay
Admission: EM | Admit: 2024-02-23 | Discharge: 2024-02-23 | Disposition: A | Discharge status: Home / Self Care | Attending: Obstetrics and Gynecology | Admitting: Obstetrics and Gynecology

## 2024-02-23 ENCOUNTER — Other Ambulatory Visit: Payer: Self-pay | Admitting: Certified Nurse Midwife

## 2024-02-23 ENCOUNTER — Inpatient Hospital Stay

## 2024-02-23 DIAGNOSIS — O364XX0 Maternal care for intrauterine death, not applicable or unspecified: Principal | ICD-10-CM | POA: Diagnosis present

## 2024-02-23 DIAGNOSIS — Z7982 Long term (current) use of aspirin: Secondary | ICD-10-CM | POA: Diagnosis not present

## 2024-02-23 DIAGNOSIS — Z3A31 31 weeks gestation of pregnancy: Secondary | ICD-10-CM | POA: Diagnosis not present

## 2024-02-23 MED ORDER — HYDROXYZINE PAMOATE 50 MG PO CAPS: 50.0000 mg | ORAL_CAPSULE | Freq: Three times a day (TID) | ORAL | 0 refills | Status: AC | PRN

## 2024-02-23 NOTE — OB Triage Note (Signed)
      L&D OB Triage Note  SUBJECTIVE Christina Marquez is a 38 y.o. G24P2002 female at [redacted]w[redacted]d, EDD Estimated Date of Delivery: 04/26/24 who presented to triage with complaints of no fetal movement since yesterday. She denies contractions , loss of fluid , and vaginal bleeding.   OB History  Gravida Para Term Preterm AB Living  3 2 2  0 0 2  SAB IAB Ectopic Multiple Live Births  0 0 0 0 2    # Outcome Date GA Lbr Len/2nd Weight Sex Type Anes PTL Lv  3 Current           2 Term 02/24/13 [redacted]w[redacted]d   F VBAC   LIV  1 Term 05/02/01 [redacted]w[redacted]d   M CS-LTranv   LIV    Medications Prior to Admission  Medication Sig Dispense Refill Last Dose/Taking   aspirin  EC 81 MG tablet Take 1 tablet (81 mg total) by mouth daily. Take after 12 weeks for prevention of preeclampssia later in pregnancy 90 tablet 2    nicotine  polacrilex (NICOTINE  MINI) 2 MG lozenge Take 1 lozenge (2 mg total) by mouth as needed for smoking cessation. 100 tablet 0    Prenatal Vit-Fe Fum-Fe Bisg-FA (NATACHEW ) 28-1 MG CHEW Chew 1 tablet by mouth daily. 90 tablet 6      OBJECTIVE  Nursing Evaluation:   BP 125/68   Pulse 89   LMP 07/21/2023 (Within Days)   SpO2 100%    Findings:   Fetal heart tones not found on EFM.   - Stat bedside ultrasound ordered  EXAM: ULTRASOUND OB LIMITED   TECHNIQUE: Transabdominal obstetric pelvic ultrasound was performed with color Doppler flow evaluation.   COMPARISON: None available.   CLINICAL HISTORY: [id number] Pregnancy 103531; [id number] Non-reassuring fetal heart tones complicating pregnancy, antepartum 733037 Pregnancy; Non-reassuring fetal heart tones complicating pregnancy, antepartum   FINDINGS:   FETUS: A single intrauterine gestation is present. No movement.   POSITION: Cephalic presentation.   HEART RATE: Fetal heart beat not observed. No cardiac motion .   PLACENTA: Posterior placenta. No previa.   AMNIOTIC FLUID: Normal amniotic fluid subjectively.     BPD measures 8.3 cm  corresponding to a 33-week 2-day gestational age.     IMPRESSION: 1. No fetal heartbeat or fetal movement observed. Critical value/emergent results were called by telephone at the time of interpretation on no evidence 11 / 27 / 25 at 10:30 am to Zelda Hummer CNM, who verbally acknowledged these results.   Electronically signed by: Norman Gatlin MD 02/23/2024 10:32 AM EST RP Workstation: [id number]          ASSESSMENT Impression:  1.  Pregnancy:  G3P2002 at [redacted]w[redacted]d , EDD Estimated Date of Delivery: 04/26/24 2.  IUFD . SABRA    PLAN 1. Current condition and above findings reviewed.  Discussed with pt and her family options to proceed with Delivery or discharge home will induction in the next week. She request to go home today to be with her family. She will return on Sunday @ 0000 for induction 2. Discharge home Reviewed precautions with her and her family. She verbalizes understanding.  3. Follow up 02/26/24 @ 0000 for induction.     Zelda Hummer, CNM

## 2024-02-23 NOTE — OB Triage Note (Signed)
 Patient presented to the unit for evaluation with complaints of no fetal movement since yesterday.  Reports this is her third pregnancy and she plans to VBAC, Denies vaginal bleeding, contractions or leaking fluid.  Vital signs stable, however, unable to detect fetal heart rate with EFM. Zelda Hummer, CNM in department and informed of findings. CNM to bedside to complete a bedside ultrasound.

## 2024-02-23 NOTE — Progress Notes (Signed)
 Patient returned to the unit, family members present with her.  Zelda Hummer, CNM at bedside to discuss plan of care moving forward.  Jaslene to discuss with family her options and states she will let us  know when they reach a decision.

## 2024-02-23 NOTE — Progress Notes (Signed)
 Discharge instructions reviewed with patient who verbalized understanding. IOL appointment reviewed and signs of labor to watch for, Rihanna states she understands. Discharged home with family members

## 2024-02-23 NOTE — Progress Notes (Addendum)
 After discussing with her family Cereniti expressed her preference to be discharged today and come in on Sunday for IOL/ TOLAC. States she needs a couple days to get her affairs in order and her daughters birthday is Saturday, she would prefer to avoid that date for the birth.  Instructed Caressa she should return to the emergency room immediately if signs of labor occur before the scheduled IOL, she verbalized understanding.

## 2024-02-23 NOTE — Progress Notes (Signed)
 Limited OB ultrasound at bedside completed with CNM present, initial impression did not show fetal heart rate activity. Patient distraught stating she needs to go outside and get some air. Informed her we would prefer she stays on the unit but patient addiment she has to go out for a few moments. Patient has left the unit, educated to return as soon as she is able.

## 2024-02-26 ENCOUNTER — Inpatient Hospital Stay: Admitting: Anesthesiology

## 2024-02-26 ENCOUNTER — Other Ambulatory Visit: Payer: Self-pay

## 2024-02-26 ENCOUNTER — Encounter: Payer: Self-pay | Admitting: Obstetrics and Gynecology

## 2024-02-26 ENCOUNTER — Inpatient Hospital Stay
Admission: EM | Admit: 2024-02-26 | Discharge: 2024-02-27 | DRG: 807 | Disposition: A | Attending: Obstetrics and Gynecology | Admitting: Obstetrics and Gynecology

## 2024-02-26 ENCOUNTER — Other Ambulatory Visit: Payer: Self-pay | Admitting: Certified Nurse Midwife

## 2024-02-26 DIAGNOSIS — F17210 Nicotine dependence, cigarettes, uncomplicated: Secondary | ICD-10-CM | POA: Diagnosis present

## 2024-02-26 DIAGNOSIS — Z833 Family history of diabetes mellitus: Secondary | ICD-10-CM

## 2024-02-26 DIAGNOSIS — O34211 Maternal care for low transverse scar from previous cesarean delivery: Secondary | ICD-10-CM | POA: Diagnosis present

## 2024-02-26 DIAGNOSIS — Z8249 Family history of ischemic heart disease and other diseases of the circulatory system: Secondary | ICD-10-CM

## 2024-02-26 DIAGNOSIS — O364XX0 Maternal care for intrauterine death, not applicable or unspecified: Principal | ICD-10-CM | POA: Diagnosis present

## 2024-02-26 DIAGNOSIS — O34219 Maternal care for unspecified type scar from previous cesarean delivery: Secondary | ICD-10-CM | POA: Diagnosis not present

## 2024-02-26 DIAGNOSIS — Z3A31 31 weeks gestation of pregnancy: Secondary | ICD-10-CM

## 2024-02-26 DIAGNOSIS — O9962 Diseases of the digestive system complicating childbirth: Secondary | ICD-10-CM | POA: Diagnosis present

## 2024-02-26 DIAGNOSIS — O99214 Obesity complicating childbirth: Secondary | ICD-10-CM | POA: Diagnosis present

## 2024-02-26 DIAGNOSIS — O43123 Velamentous insertion of umbilical cord, third trimester: Secondary | ICD-10-CM | POA: Diagnosis present

## 2024-02-26 DIAGNOSIS — O99334 Smoking (tobacco) complicating childbirth: Secondary | ICD-10-CM | POA: Diagnosis present

## 2024-02-26 DIAGNOSIS — O09523 Supervision of elderly multigravida, third trimester: Secondary | ICD-10-CM

## 2024-02-26 DIAGNOSIS — Z98891 History of uterine scar from previous surgery: Principal | ICD-10-CM

## 2024-02-26 DIAGNOSIS — K219 Gastro-esophageal reflux disease without esophagitis: Secondary | ICD-10-CM | POA: Diagnosis present

## 2024-02-26 LAB — SYPHILIS: RPR W/REFLEX TO RPR TITER AND TREPONEMAL ANTIBODIES, TRADITIONAL SCREENING AND DIAGNOSIS ALGORITHM: RPR Ser Ql: NONREACTIVE

## 2024-02-26 LAB — CBC
HCT: 35.3 % — ABNORMAL LOW (ref 36.0–46.0)
Hemoglobin: 12 g/dL (ref 12.0–15.0)
MCH: 30.1 pg (ref 26.0–34.0)
MCHC: 34 g/dL (ref 30.0–36.0)
MCV: 88.5 fL (ref 80.0–100.0)
Platelets: 309 K/uL (ref 150–400)
RBC: 3.99 MIL/uL (ref 3.87–5.11)
RDW: 14.2 % (ref 11.5–15.5)
WBC: 16.7 K/uL — ABNORMAL HIGH (ref 4.0–10.5)
nRBC: 0 % (ref 0.0–0.2)

## 2024-02-26 MED ORDER — MISOPROSTOL 200 MCG PO TABS
ORAL_TABLET | ORAL | Status: AC
  Filled 2024-02-26: qty 4

## 2024-02-26 MED ORDER — ACETAMINOPHEN 325 MG PO TABS: 650.0000 mg | ORAL_TABLET | ORAL | Status: DC | PRN

## 2024-02-26 MED ORDER — LACTATED RINGERS IV SOLN: INTRAVENOUS | Status: DC

## 2024-02-26 MED ORDER — OXYTOCIN BOLUS FROM INFUSION
333.0000 mL | Freq: Once | INTRAVENOUS | Status: AC
  Administered 2024-02-26: 333 mL via INTRAVENOUS

## 2024-02-26 MED ORDER — EPHEDRINE 5 MG/ML INJ: 10.0000 mg | INTRAVENOUS | Status: DC | PRN

## 2024-02-26 MED ORDER — LACTATED RINGERS IV SOLN
500.0000 mL | Freq: Once | INTRAVENOUS | Status: AC
  Administered 2024-02-26: 500 mL via INTRAVENOUS

## 2024-02-26 MED ORDER — PHENYLEPHRINE 80 MCG/ML (10ML) SYRINGE FOR IV PUSH (FOR BLOOD PRESSURE SUPPORT): 80.0000 ug | PREFILLED_SYRINGE | INTRAVENOUS | Status: DC | PRN

## 2024-02-26 MED ORDER — LIDOCAINE HCL (PF) 1 % IJ SOLN: 30.0000 mL | INTRAMUSCULAR | Status: DC | PRN

## 2024-02-26 MED ORDER — FENTANYL CITRATE (PF) 100 MCG/2ML IJ SOLN
50.0000 ug | INTRAMUSCULAR | Status: DC | PRN
  Administered 2024-02-26: 100 ug via INTRAVENOUS
  Filled 2024-02-26 (×2): qty 2

## 2024-02-26 MED ORDER — LIDOCAINE-EPINEPHRINE (PF) 1.5 %-1:200000 IJ SOLN
INTRAMUSCULAR | Status: DC | PRN
  Administered 2024-02-26: 3 mL via EPIDURAL

## 2024-02-26 MED ORDER — AMMONIA AROMATIC IN INHA
RESPIRATORY_TRACT | Status: AC
  Filled 2024-02-26: qty 10

## 2024-02-26 MED ORDER — LIDOCAINE HCL (PF) 1 % IJ SOLN
INTRAMUSCULAR | Status: DC | PRN
  Administered 2024-02-26: 3 mL via SUBCUTANEOUS

## 2024-02-26 MED ORDER — HYDROXYZINE HCL 25 MG PO TABS
50.0000 mg | ORAL_TABLET | Freq: Four times a day (QID) | ORAL | Status: DC | PRN
  Administered 2024-02-26: 50 mg via ORAL
  Filled 2024-02-26: qty 2

## 2024-02-26 MED ORDER — OXYTOCIN 10 UNIT/ML IJ SOLN
INTRAMUSCULAR | Status: AC
  Filled 2024-02-26: qty 2

## 2024-02-26 MED ORDER — DIPHENHYDRAMINE HCL 50 MG/ML IJ SOLN
12.5000 mg | INTRAMUSCULAR | Status: AC | PRN
  Administered 2024-02-26 (×3): 12.5 mg via INTRAVENOUS
  Filled 2024-02-26 (×2): qty 1

## 2024-02-26 MED ORDER — LACTATED RINGERS IV SOLN: 500.0000 mL | INTRAVENOUS | Status: AC | PRN

## 2024-02-26 MED ORDER — FENTANYL-BUPIVACAINE-NACL 0.5-0.125-0.9 MG/250ML-% EP SOLN
12.0000 mL/h | EPIDURAL | Status: DC | PRN
  Administered 2024-02-26: 12 mL/h via EPIDURAL
  Filled 2024-02-26: qty 250

## 2024-02-26 MED ORDER — SOD CITRATE-CITRIC ACID 500-334 MG/5ML PO SOLN: 30.0000 mL | ORAL | Status: DC | PRN

## 2024-02-26 MED ORDER — LIDOCAINE HCL (PF) 1 % IJ SOLN
INTRAMUSCULAR | Status: AC
  Filled 2024-02-26: qty 30

## 2024-02-26 MED ORDER — OXYTOCIN-SODIUM CHLORIDE 30-0.9 UT/500ML-% IV SOLN
1.0000 m[IU]/min | INTRAVENOUS | Status: DC
  Administered 2024-02-26: 2 m[IU]/min via INTRAVENOUS

## 2024-02-26 MED ORDER — SODIUM CHLORIDE 0.9 % IV SOLN
INTRAVENOUS | Status: DC | PRN
  Administered 2024-02-26 (×2): 5 mL via EPIDURAL

## 2024-02-26 MED ORDER — ONDANSETRON HCL 4 MG/2ML IJ SOLN: 4.0000 mg | Freq: Four times a day (QID) | INTRAMUSCULAR | Status: DC | PRN

## 2024-02-26 MED ORDER — OXYTOCIN-SODIUM CHLORIDE 30-0.9 UT/500ML-% IV SOLN
2.5000 [IU]/h | INTRAVENOUS | Status: DC
  Administered 2024-02-26: 2.5 [IU]/h via INTRAVENOUS
  Filled 2024-02-26: qty 500

## 2024-02-26 NOTE — Anesthesia Preprocedure Evaluation (Signed)
 Anesthesia Evaluation  Patient identified by MRN, date of birth, ID band Patient awake    Reviewed: Allergy & Precautions, H&P , NPO status , Patient's Chart, lab work & pertinent test results, reviewed documented beta blocker date and time   History of Anesthesia Complications Negative for: history of anesthetic complications  Airway Mallampati: II  TM Distance: >3 FB Neck ROM: full    Dental no notable dental hx.    Pulmonary neg shortness of breath, neg sleep apnea, neg COPD, Recent URI , Current Smoker   Pulmonary exam normal breath sounds clear to auscultation       Cardiovascular Exercise Tolerance: Good negative cardio ROS Normal cardiovascular exam Rhythm:regular Rate:Normal     Neuro/Psych negative neurological ROS  negative psych ROS   GI/Hepatic Neg liver ROS,GERD  ,,  Endo/Other  negative endocrine ROS    Renal/GU negative Renal ROS  negative genitourinary   Musculoskeletal   Abdominal   Peds  Hematology negative hematology ROS (+)   Anesthesia Other Findings Here for fetal demise at 31 weeks  Past Medical History: No date: Anxiety 2003: Vaginal Pap smear, abnormal     Comment:  LEEP   Reproductive/Obstetrics (+) Pregnancy                              Anesthesia Physical Anesthesia Plan  ASA: 2  Anesthesia Plan: Epidural   Post-op Pain Management:    Induction:   PONV Risk Score and Plan:   Airway Management Planned:   Additional Equipment:   Intra-op Plan:   Post-operative Plan:   Informed Consent: I have reviewed the patients History and Physical, chart, labs and discussed the procedure including the risks, benefits and alternatives for the proposed anesthesia with the patient or authorized representative who has indicated his/her understanding and acceptance.     Dental Advisory Given  Plan Discussed with: Anesthesiologist, CRNA and  Surgeon  Anesthesia Plan Comments:         Anesthesia Quick Evaluation

## 2024-02-26 NOTE — Progress Notes (Signed)
 Labor Progress Note   ASSESSMENT/PLAN   Christina Marquez 38 y.o.   [id number]  at [redacted]w[redacted]d admited for IOL due to IUFD.  Hx LTCS - Continuous toco - Cook catheter for ripening placed at 1000. Continue pitocin.  Fetal disposition: - Does not desire to hold or see infant immediately after birth - Discussed options of autopsy (cost out of pocket), Microarray through [facility]-unsure at this time, will continue to discuss - Placenta to pathology  LABOR: - Now in latent labor, doing well. - Pain Management: Epidural in place and working well. - Cook catheter in place. Pitocin infusing.  - Anticipate SVD   Principal Problem:   IUFD at 20 weeks or more of gestation     SUBJECTIVE/OBJECTIVE   SUBJECTIVE: Comfortable with epidural.     OBJECTIVE: Vital Signs: Patient Vitals for the past 12 hrs:  BP Temp Temp src Pulse Resp SpO2 Height Weight  02/26/24 1102 -- 98 F (36.7 C) Oral -- 16 -- -- --  02/26/24 1055 -- -- -- -- -- 95 % -- --  02/26/24 1050 -- -- -- -- -- 95 % -- --  02/26/24 1045 -- -- -- -- -- 94 % -- --  02/26/24 1040 -- -- -- -- -- 95 % -- --  02/26/24 1035 -- -- -- -- -- 94 % -- --  02/26/24 1030 -- -- -- -- -- 95 % -- --  02/26/24 1025 -- -- -- -- -- 96 % -- --  02/26/24 1021 -- -- -- -- -- 97 % -- --  02/26/24 1020 -- -- -- -- -- 97 % -- --  02/26/24 1015 -- -- -- -- -- 99 % -- --  02/26/24 1010 -- -- -- -- -- 97 % -- --  02/26/24 1008 108/70 -- -- 80 -- -- -- --  02/26/24 1005 -- -- -- -- -- 96 % -- --  02/26/24 1000 -- -- -- -- -- 96 % -- --  02/26/24 0955 -- -- -- -- -- 95 % -- --  02/26/24 0953 110/62 -- -- 84 -- -- -- --  02/26/24 0950 -- -- -- -- -- 94 % -- --  02/26/24 0945 -- -- -- -- -- 95 % -- --  02/26/24 0940 -- -- -- -- -- 94 % -- --  02/26/24 0938 109/63 -- -- 82 -- -- -- --  02/26/24 0935 -- -- -- -- -- 94 % -- --  02/26/24 0930 -- -- -- -- -- 94 % -- --  02/26/24 0925 -- -- -- -- -- 94 % -- --  02/26/24 0923 (!) 106/58 -- -- 80 -- -- -- --   02/26/24 0920 -- -- -- -- -- 94 % -- --  02/26/24 0915 -- -- -- -- -- 94 % -- --  02/26/24 0910 -- -- -- -- -- 94 % -- --  02/26/24 0908 112/64 -- -- 77 -- 97 % -- --  02/26/24 0903 115/64 -- -- 83 -- -- -- --  02/26/24 0900 -- -- -- -- -- 94 % -- --  02/26/24 0858 107/60 -- -- 79 -- -- -- --  02/26/24 0855 -- -- -- -- -- 94 % -- --  02/26/24 0854 (!) 117/56 -- -- 72 -- -- -- --  02/26/24 0850 -- -- -- -- -- 95 % -- --  02/26/24 0848 109/73 -- -- 83 -- -- -- --  02/26/24 0846 111/66 -- -- 84 -- -- -- --  02/26/24 0845 -- -- -- -- --  95 % -- --  02/26/24 0840 -- -- -- -- -- 96 % -- --  02/26/24 0835 -- -- -- -- -- 97 % -- --  02/26/24 0830 -- -- -- -- -- 95 % -- --  02/26/24 0825 -- -- -- -- -- 94 % -- --  02/26/24 0820 -- -- -- -- -- 94 % -- --  02/26/24 0815 -- -- -- -- -- 94 % -- --  02/26/24 0810 -- -- -- -- -- 94 % -- --  02/26/24 0805 -- -- -- -- -- 96 % -- --  02/26/24 0800 -- -- -- -- -- 96 % -- --  02/26/24 0758 116/73 97.9 F (36.6 C) Oral 82 18 -- -- --  02/26/24 0650 115/66 97.9 F (36.6 C) Oral 80 (!) 24 -- -- --  02/26/24 0120 (!) 105/59 97.9 F (36.6 C) Oral 90 18 -- 5' 6 (1.676 m) 103 kg    Last SVE:  Dilation: 1.5 (02/26/24 0954) Effacement (%): Thick (02/26/24 0734) Cervical Position: Middle (02/26/24 0734) Station: [place] (02/26/24 0734) Exam by:: CANDIE Lakes CNM (02/26/24 0954)     UTERINE ACTIVITY:   - Mode: Toco  - Contraction Frequency (min): q 2-4 minutes  Pitocin: 14 mu/min

## 2024-02-26 NOTE — Discharge Summary (Signed)
 Postpartum Discharge Summary  Date of Service updated***     Patient Name: Christina Marquez DOB: 02/20/1986 MRN: [id number]  Date of admission: 02/26/2024 Delivery date:02/26/2024 Delivering provider:   Date of discharge: 02/26/2024  Admitting diagnosis: Induction of labor Intrauterine pregnancy: [redacted]w[redacted]d     Secondary diagnosis:  Principal Problem:   IUFD at 20 weeks or more of gestation  Additional problems: History of c-section x1    Discharge diagnosis: s/p VBAC of stillborn female infant                                              Post partum procedures:{Postpartum procedures:23558} Induction: AROM, Pitocin, and IP Foley Complications: [facility] course: Induction of Labor With Vaginal Delivery   38 y.o. yo G3P2002 at [redacted]w[redacted]d was admitted to the hospital 02/26/2024 for induction of labor.  Indication for induction: fetal demise.  Patient had an labor course that was uncomplicated. Membrane Rupture Time/Date: 5:39 PM,02/26/2024  Delivery Method:Vaginal, Spontaneous Episiotomy: None Lacerations:  None Details of delivery can be found in separate delivery note.  Patient had a postpartum course complicated by***. Patient is discharged home 02/26/24.  Newborn Data: Birth date:02/26/2024 Birth time:8:12 PM Gender:Female Living status:Fetal Demise Apgars:0 ,0  Weight:1730 g  Magnesium Sulfate received: No BMZ received: No Rhophylac:N/A MMR: N/A T-DaP:Given prenatally Flu: Declined RSV Vaccine received: No Transfusion:No Immunizations administered: Immunization History  Administered Date(s) Administered   Tdap 02/06/2024    Recommend 6 weeks of prophylactic anticoagulation with LMWH or subcutaneous unfractionated heparin if 1 or more high risk factor is present.  Recommend 14 days of prophylactic anticoagulation with LMWH or subcutaneous unfractionated heparin if 3 or more moderate risk factors are present.   Risk assessment for postpartum VTE and  prophylactic treatment:  High risk factors: None Moderate risk factors: None and Tobacco use > 1/2 PPD  Postpartum VTE prophylaxis with LMWH not indicated   Physical exam  Vitals:   02/26/24 1708 02/26/24 1930 02/26/24 2015 02/26/24 2030  BP: (!) 107/51   129/86  Pulse: 79  100 92  Resp:      Temp:      TempSrc:      SpO2:  97%    Weight:      Height:       General: alert, tearful/ grieving Lochia: appropriate Uterine Fundus: firm Incision: N/A DVT Evaluation: No evidence of DVT seen on physical exam. Labs: Lab Results  Component Value Date   WBC 16.7 (H) 02/26/2024   HGB 12.0 02/26/2024   HCT 35.3 (L) 02/26/2024   MCV 88.5 02/26/2024   PLT 309 02/26/2024      Latest Ref Rng & Units 11/20/2023    5:27 PM  CMP  Glucose 70 - 99 mg/dL 89   BUN 6 - 20 mg/dL 15   Creatinine 9.55 - 1.00 mg/dL 9.52   Sodium 864 - 854 mmol/L 135   Potassium 3.5 - 5.1 mmol/L 3.6   Chloride 98 - 111 mmol/L 108   CO2 22 - 32 mmol/L 19   Calcium 8.9 - 10.3 mg/dL 9.0   Total Protein 6.5 - 8.1 g/dL 6.6   Total Bilirubin 0.0 - 1.2 mg/dL 0.4   Alkaline Phos 38 - 126 U/L 70   AST 15 - 41 U/L 24   ALT 0 - 44 U/L 34    Edinburgh Score:  No data to display             After visit meds:  Allergies as of 02/26/2024   No Known Allergies   Med Rec must be completed prior to using this [facility]***        Discharge home in stable condition Infant Disposition:{CHL IP OB HOME WITH FNUYZM:76418} Discharge instruction: per After Visit Summary and Postpartum booklet. Activity: Advance as tolerated. Pelvic rest for 6 weeks.  Diet: routine diet Anticipated Birth Control: Plans Interval BTL Postpartum VTE prophyhlaxis: Not Indicated Postpartum Appointment:1 week Additional Postpartum F/U: Postpartum Depression checkup Future Appointments: Future Appointments  Date Time Provider Department Center  03/07/2024 11:15 AM Slaughterbeck, Damien, CNM [facility] None   Follow up  Visit:      02/26/2024 Lauraine Lakes, CNM

## 2024-02-26 NOTE — Progress Notes (Signed)
 Labor Progress Note   ASSESSMENT/PLAN   Christina Marquez 38 y.o.   [id number]  at [redacted]w[redacted]d admited for IOL for IUFD.  LABOR: - Now in labor. Cook catheter is out. AROM performed. Continue pitocin.  - Pain Management: epidural in place - Pt reaffirms her desire NOT to see or hold the baby. - Anticipate SVD   Principal Problem:   IUFD at 20 weeks or more of gestation     SUBJECTIVE/OBJECTIVE   SUBJECTIVE:   Cramping is improved after self-bolusing epidural with PCA.  OBJECTIVE: Vital Signs: Patient Vitals for the past 12 hrs:  BP Temp Temp src Pulse Resp SpO2  02/26/24 1708 (!) 107/51 -- -- 79 -- --  02/26/24 1707 -- 97.9 F (36.6 C) Oral -- 18 95 %  02/26/24 1608 114/61 -- -- 83 -- --  02/26/24 1508 116/62 -- -- 86 -- --  02/26/24 1318 -- 98.2 F (36.8 C) Oral -- -- --  02/26/24 1315 -- -- -- -- -- 96 %  02/26/24 1310 -- -- -- -- -- 96 %  02/26/24 1308 123/72 -- -- 79 -- --  02/26/24 1305 -- -- -- -- -- 94 %  02/26/24 1301 -- -- -- -- -- 95 %  02/26/24 1300 -- -- -- -- -- 95 %  02/26/24 1255 -- -- -- -- -- 94 %  02/26/24 1250 -- -- -- -- -- 95 %  02/26/24 1245 -- -- -- -- -- 95 %  02/26/24 1240 -- -- -- -- -- 96 %  02/26/24 1235 -- -- -- -- -- 94 %  02/26/24 1230 -- -- -- -- -- 94 %  02/26/24 1225 -- -- -- -- -- 97 %  02/26/24 1220 -- -- -- -- -- 94 %  02/26/24 1215 -- -- -- -- -- 94 %  02/26/24 1210 -- -- -- -- -- 96 %  02/26/24 1208 117/71 -- -- 82 -- --  02/26/24 1205 -- -- -- -- -- 96 %  02/26/24 1200 -- -- -- -- -- 97 %  02/26/24 1155 -- -- -- -- -- 96 %  02/26/24 1102 -- 98 F (36.7 C) Oral -- 16 --  02/26/24 1055 -- -- -- -- -- 95 %  02/26/24 1050 -- -- -- -- -- 95 %  02/26/24 1045 -- -- -- -- -- 94 %  02/26/24 1040 -- -- -- -- -- 95 %  02/26/24 1035 -- -- -- -- -- 94 %  02/26/24 1030 -- -- -- -- -- 95 %  02/26/24 1025 -- -- -- -- -- 96 %  02/26/24 1021 -- -- -- -- -- 97 %  02/26/24 1020 -- -- -- -- -- 97 %  02/26/24 1015 -- -- -- -- -- 99 %  02/26/24 1010  -- -- -- -- -- 97 %  02/26/24 1008 108/70 -- -- 80 -- --  02/26/24 1005 -- -- -- -- -- 96 %  02/26/24 1000 -- -- -- -- -- 96 %  02/26/24 0955 -- -- -- -- -- 95 %  02/26/24 0953 110/62 -- -- 84 -- --  02/26/24 0950 -- -- -- -- -- 94 %  02/26/24 0945 -- -- -- -- -- 95 %  02/26/24 0940 -- -- -- -- -- 94 %  02/26/24 0938 109/63 -- -- 82 -- --  02/26/24 0935 -- -- -- -- -- 94 %  02/26/24 0930 -- -- -- -- -- 94 %  02/26/24 0925 -- -- -- -- -- 94 %  02/26/24 0923 (!) 106/58 -- -- 80 -- --  02/26/24 0920 -- -- -- -- -- 94 %  02/26/24 0915 -- -- -- -- -- 94 %  02/26/24 0910 -- -- -- -- -- 94 %  02/26/24 0908 112/64 -- -- 77 -- 97 %  02/26/24 0903 115/64 -- -- 83 -- --  02/26/24 0900 -- -- -- -- -- 94 %  02/26/24 0858 107/60 -- -- 79 -- --  02/26/24 0855 -- -- -- -- -- 94 %  02/26/24 0854 (!) 117/56 -- -- 72 -- --  02/26/24 0850 -- -- -- -- -- 95 %  02/26/24 0848 109/73 -- -- 83 -- --  02/26/24 0846 111/66 -- -- 84 -- --  02/26/24 0845 -- -- -- -- -- 95 %  02/26/24 0840 -- -- -- -- -- 96 %  02/26/24 0835 -- -- -- -- -- 97 %  02/26/24 0830 -- -- -- -- -- 95 %  02/26/24 0825 -- -- -- -- -- 94 %  02/26/24 0820 -- -- -- -- -- 94 %  02/26/24 0815 -- -- -- -- -- 94 %  02/26/24 0810 -- -- -- -- -- 94 %  02/26/24 0805 -- -- -- -- -- 96 %  02/26/24 0800 -- -- -- -- -- 96 %  02/26/24 0758 116/73 97.9 F (36.6 C) Oral 82 18 --  02/26/24 0650 115/66 97.9 F (36.6 C) Oral 80 (!) 24 --   Last SVE:  Dilation: 5 (02/26/24 1738) Effacement (%): 50 (02/26/24 1738) Cervical Position: Middle (02/26/24 0734) Station: Ballotable (02/26/24 1738) Exam by:: CANDIE Lakes CNM (02/26/24 1738)  Membranes Sac Identifier: Sac 1 Membrane Status: AROM Rupture Date: 02/26/24 Rupture Time: 1739 Color: Delores  UTERINE ACTIVITY:   - Mode: Toco  - Contraction Frequency (min): 2-4 minutes  Pitocin: 20mu

## 2024-02-26 NOTE — Progress Notes (Signed)
   02/26/24 1000  Spiritual Encounters  Type of Visit Initial  Conversation partners present during encounter Nurse  Referral source Nurse (RN/NT/LPN)  Reason for visit Grief/loss  OnCall Visit Yes   Chaplain responded to spiritual consult for patient's IUFD.  Patient informed nurse she is not ready to speak with chaplain.  Nurse will call chaplain for follow-up visit when appropriate.
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
## 2024-02-26 NOTE — Progress Notes (Signed)
 Labor Progress Note   ASSESSMENT/PLAN   Caeleigh Prohaska 38 y.o.   [id number]  at [redacted]w[redacted]d admited for IOL due to IUFD.  Hx LTCS - Continuous toco - EASI catheter for ripening & pitocin  Fetal disposition: - Does not desire to hold or see infant immediately after birth - Discussed options of autopsy (cost out of pocket), Microarray through [facility]-unsure at this time, will continue to discuss - Placenta to pathology  LABOR: - Now in latent labor, doing well. - Pain Management: planning epidural - Discussed options with patient and will continue pitocin, anesthesiologist requested for epidural then will place [facility] catheter.  - Anticipate SVD   Principal Problem:   IUFD at 20 weeks or more of gestation     SUBJECTIVE/OBJECTIVE   SUBJECTIVE: Rested some overnight after hydroxyzine , feeling contractions painfully & desires epidural placement prior to [facility] catheter.    OBJECTIVE: Vital Signs: Patient Vitals for the past 12 hrs:  BP Temp Temp src Pulse Resp Height Weight  02/26/24 0650 115/66 97.9 F (36.6 C) Oral 80 (!) 24 -- --  02/26/24 0120 (!) 105/59 97.9 F (36.6 C) Oral 90 18 5' 6 (1.676 m) 103 kg    Last SVE:  Dilation: Fingertip (02/26/24 0734) Effacement (%): Thick (02/26/24 0734) Cervical Position: Middle (02/26/24 0734) Station: [place] (02/26/24 9265) Exam by:: Jayne CNM (02/26/24 0734)     UTERINE ACTIVITY:   - Mode: Toco  - Contraction Frequency (min): 1-4.5 minutes  Pitocin: 10mu/min

## 2024-02-26 NOTE — Anesthesia Procedure Notes (Signed)
 Epidural Patient location during procedure: OB Start time: 02/26/2024 8:40 AM End time: 02/26/2024 8:43 AM  Staffing Anesthesiologist: Dario Barter, MD Performed: anesthesiologist   Preanesthetic Checklist Completed: patient identified, IV checked, site marked, risks and benefits discussed, surgical consent, monitors and equipment checked, pre-op evaluation and timeout performed  Epidural Patient position: sitting Prep: ChloraPrep Patient monitoring: heart rate, continuous pulse ox and blood pressure Approach: midline Location: L3-L4 Injection technique: LOR saline  Needle:  Needle type: Tuohy  Needle gauge: 17 G Needle length: 9 cm Needle insertion depth: 6 cm Catheter type: closed end flexible Catheter size: 19 Gauge Catheter at skin depth: 11 cm Test dose: negative and 1.5% lidocaine  with Epi 1:200 K  Assessment Sensory level: T10 Events: blood not aspirated, no cerebrospinal fluid, injection not painful, no injection resistance, no paresthesia and negative IV test  Additional Notes 1st attempt Pt. Evaluated and documentation done after procedure finished. Patient identified. Risks/Benefits/Options discussed with patient including but not limited to bleeding, infection, nerve damage, paralysis, failed block, incomplete pain control, headache, blood pressure changes, nausea, vomiting, reactions to medication both or allergic, itching and postpartum back pain. Confirmed with bedside nurse the patient's most recent platelet count. Confirmed with patient that they are not currently taking any anticoagulation, have any bleeding history or any family history of bleeding disorders. Patient expressed understanding and wished to proceed. All questions were answered. Sterile technique was used throughout the entire procedure. Please see nursing notes for vital signs. Test dose was given through epidural catheter and negative prior to continuing to dose epidural or start infusion.  Warning signs of high block given to the patient including shortness of breath, tingling/numbness in hands, complete motor block, or any concerning symptoms with instructions to call for help. Patient was given instructions on fall risk and not to get out of bed. All questions and concerns addressed with instructions to call with any issues or inadequate analgesia.    Patient tolerated the insertion well without immediate complications.Reason for block:procedure for pain

## 2024-02-26 NOTE — Progress Notes (Signed)
   02/26/24 2045  Spiritual Encounters  Type of Visit Initial  Care provided to: Patient;Pt and family;Friend  Referral source Nurse (RN/NT/LPN)  Reason for visit Grief/loss  OnCall Visit Yes  Interventions  Spiritual Care Interventions Made Established relationship of care and support;Compassionate presence;Reflective listening;Prayer;Bereavement/grief support   Chaplain called to visit with patient, mother, and friend after patient's IUFD delivery.  Provided compassionate support and presence, and provided grief support and comfort.  Family requested, and prayer lifted up for mother, family, and baby.

## 2024-02-27 MED ORDER — ACETAMINOPHEN 325 MG PO TABS: 650.0000 mg | ORAL_TABLET | ORAL | 0 refills | Status: AC | PRN

## 2024-02-27 MED ORDER — IBUPROFEN 100 MG/5ML PO SUSP
800.0000 mg | Freq: Three times a day (TID) | ORAL | Status: DC
  Filled 2024-02-27 (×2): qty 40

## 2024-02-27 MED ORDER — WITCH HAZEL-GLYCERIN EX PADS: 1.0000 | MEDICATED_PAD | CUTANEOUS | 12 refills | Status: AC | PRN

## 2024-02-27 MED ORDER — IBUPROFEN 800 MG PO TABS: 800.0000 mg | ORAL_TABLET | Freq: Three times a day (TID) | ORAL | 0 refills | Status: DC

## 2024-02-27 NOTE — Plan of Care (Signed)
  Problem: Education: Goal: Knowledge of Childbirth will improve Outcome: Not Applicable Goal: Ability to make informed decisions regarding treatment and plan of care will improve Outcome: Adequate for Discharge Goal: Ability to state and carry out methods to decrease the pain will improve Outcome: Adequate for Discharge Goal: Individualized Educational Video(s) Outcome: Not Applicable   Problem: Coping: Goal: Ability to verbalize concerns and feelings about labor and delivery will improve Outcome: Adequate for Discharge   Problem: Life Cycle: Goal: Ability to make normal progression through stages of labor will improve Outcome: Completed/Met Goal: Ability to effectively push during vaginal delivery will improve Outcome: Completed/Met   Problem: Role Relationship: Goal: Will demonstrate positive interactions with the child Outcome: Not Applicable   Problem: Life Cycle: Goal: Ability to make normal progression through stages of labor will improve Outcome: Completed/Met Goal: Ability to effectively push during vaginal delivery will improve Outcome: Completed/Met   Problem: Safety: Goal: Risk of complications during labor and delivery will decrease Outcome: Adequate for Discharge   Problem: Pain Management: Goal: Relief or control of pain from uterine contractions will improve Outcome: Adequate for Discharge

## 2024-02-29 LAB — SURGICAL PATHOLOGY

## 2024-03-01 LAB — TYPE AND SCREEN
ABO/RH(D): A POS
Antibody Screen: NEGATIVE

## 2024-03-03 ENCOUNTER — Ambulatory Visit: Payer: Self-pay | Admitting: Registered Nurse

## 2024-03-03 NOTE — Progress Notes (Unsigned)
 Virtual Visit via Video Note  I connected with Christina Marquez on 03/05/24 at  11:40 AM EST by a video enabled telemedicine application and verified that I am speaking with the correct person using two identifiers.  Location: Patient: At her residence  Provider: [facility] office   I discussed the limitations of evaluation and management by telemedicine and the availability of in person appointments. The patient expressed understanding and agreed to proceed.    History of Present Illness:   Christina Marquez is a 38 y.o. G63P2002 female who presents for a 1 week televisit for mood check. She is 1 weeks postpartum following a IOL for 30 week IUFD.  Postpartum course has been well so far, medically. Breasts are comfortable, not engorged. Bleeding: is like a period. Denies pain.  Postpartum depression screening: positive.  EDPS score is 12 on the day of delivery. Has good social and family support, but describes herself as a closed off person. She has previously seen a therapist at [facility]. She is no currently on any antidepressants or antianxiety meds, although those have been recommended to her in the past. She is trying not to use alcohol to cope. She is using marijuana to help manage her anxiety, which has worked well for her historically.  Family members took baby items out of Christina Marquez's bedroom yesterday at her request. Her 56 year old daughter sometimes is very accepting and sometimes has a harder time. Her (young) adult son who still lives with her checks in on her a lot to ask if she's okay. He seems to be coping well.  She continues to desire interval BTL. She is expecting to hear from Dr. Alonzo office this week re: scheduling surgical consultation.   The following portions of the patient's history were reviewed and updated as appropriate: allergies, current medications, past family history, past medical history, past social history, past surgical history, and problem list.   Observations/Objective:    Last menstrual period 07/21/2023. Gen App: NAD Psych: normal speech, depressed affect.      02/26/2024   11:00 PM  Edinburgh Postnatal Depression Scale Screening Tool  I have been able to laugh and see the funny side of things. 1  I have looked forward with enjoyment to things. 1  I have blamed myself unnecessarily when things went wrong. 1  I have been anxious or worried for no good reason. 2  I have felt scared or panicky for no good reason. 1  Things have been getting on top of me. 2  I have been so unhappy that I have had difficulty sleeping. 2  I have felt sad or miserable. 1  I have been so unhappy that I have been crying. 1  The thought of harming myself has occurred to me. 0  Edinburgh Postnatal Depression Scale Total 12      Assessment and Plan:   1. IUFD at 20 weeks or more of gestation (Primary)    Follow Up Instructions: Reviewed with patient that she should expect to hear from schedulers to make appointment with Dr. Glenys Birk for consultation for laparoscopic BTL.  BTL papers were signed on 11/25, so anticipate procedure being done after 12/25. I have relayed logistical considerations to Dr. Birk re: Christina Marquez's work schedule, and 4 weeks paid leave. Efforts will be made to accommodate these limitations.   I discussed the assessment and treatment plan with the patient. The patient was provided an opportunity to ask questions and all were answered. The patient agreed with  the plan and demonstrated an understanding of the instructions.   The patient was advised to call back or seek an in-person evaluation if the symptoms worsen or if the condition fails to improve as anticipated.  Plan f/u telehealth visit to check in on mood in 2 weeks. Advised patient to reach out sooner if she would like to discuss medication for depression/ anxiety sooner. Has contact info for [facility] therapist, Alan, who she has worked with in the past.   Lauraine Lakes, CNM

## 2024-03-05 ENCOUNTER — Telehealth: Admitting: Registered Nurse

## 2024-03-05 ENCOUNTER — Telehealth: Payer: Self-pay

## 2024-03-05 DIAGNOSIS — O364XX0 Maternal care for intrauterine death, not applicable or unspecified: Secondary | ICD-10-CM

## 2024-03-05 NOTE — Telephone Encounter (Signed)
 I called Christina Marquez about her FMLA/STD and left voicemail.

## 2024-03-07 ENCOUNTER — Encounter: Admitting: Certified Nurse Midwife

## 2024-03-13 ENCOUNTER — Encounter: Admitting: Registered Nurse

## 2024-03-13 ENCOUNTER — Encounter: Payer: Self-pay | Admitting: *Deleted

## 2024-03-18 NOTE — Anesthesia Postprocedure Evaluation (Signed)
"   Anesthesia Post Note  Patient: Christina Marquez  Procedure(s) Performed: AN AD HOC LABOR EPIDURAL  Anesthesia Type: Epidural Pain management: pain level controlled Vital Signs Assessment: post-procedure vital signs reviewed and stable Cardiovascular status: stable Anesthetic complications: no Comments: Patient was discharged prior to being seen by the post-op anesthesia team, but was not having any problems prior to discharge per nursing notes.   No notable events documented.   Last Vitals: There were no vitals filed for this visit.  Last Pain: There were no vitals filed for this visit.               Prentice Murphy      "

## 2024-03-20 ENCOUNTER — Ambulatory Visit (INDEPENDENT_AMBULATORY_CARE_PROVIDER_SITE_OTHER): Admitting: Family Medicine

## 2024-03-20 ENCOUNTER — Encounter: Payer: Self-pay | Admitting: *Deleted

## 2024-03-20 VITALS — BP 135/76 | HR 82

## 2024-03-20 DIAGNOSIS — Z302 Encounter for sterilization: Secondary | ICD-10-CM | POA: Diagnosis not present

## 2024-03-20 NOTE — Assessment & Plan Note (Signed)
Patient counseled, r.e. Risks benefits of BTL, including permanency of procedure, discussed benefits of salpingectomy including almost no failure and prevention of ovarian cancer.  Patient verbalized understanding and desires to proceed. Risks include but are not limited to bleeding, infection, injury to surrounding structures, including bowel, bladder and ureters, blood clots, and death.  Likelihood of success is high.

## 2024-03-20 NOTE — Progress Notes (Signed)
 CC: Tubal consult

## 2024-03-20 NOTE — Progress Notes (Signed)
" ° °  Subjective:    Patient ID: Christina Marquez is a 38 y.o. female presenting with GYN   on 03/20/2024  HPI: H/o of IUFD earlier this month. For interval BTL. Truly desires sterilization.  Review of Systems  Constitutional:  Negative for chills and fever.  Respiratory:  Negative for shortness of breath.   Cardiovascular:  Negative for chest pain.  Gastrointestinal:  Negative for abdominal pain, nausea and vomiting.  Genitourinary:  Negative for dysuria.  Skin:  Negative for rash.      Objective:    BP 135/76   Pulse 82  Physical Exam Exam conducted with a chaperone present.  Constitutional:      General: She is not in acute distress.    Appearance: She is well-developed.  HENT:     Head: Normocephalic and atraumatic.  Eyes:     General: No scleral icterus. Cardiovascular:     Rate and Rhythm: Normal rate.  Pulmonary:     Effort: Pulmonary effort is normal.  Abdominal:     Palpations: Abdomen is soft.  Musculoskeletal:     Cervical back: Neck supple.  Skin:    General: Skin is warm and dry.  Neurological:     Mental Status: She is alert and oriented to person, place, and time.         Assessment & Plan:   Problem List Items Addressed This Visit       Unprioritized   Encounter for sterilization - Primary   Patient counseled, r.e. Risks benefits of BTL, including permanency of procedure, discussed benefits of salpingectomy including almost no failure and prevention of ovarian cancer.  Patient verbalized understanding and desires to proceed Risks include but are not limited to bleeding, infection, injury to surrounding structures, including bowel, bladder and ureters, blood clots, and death.  Likelihood of success is high.       Relevant Orders   Ambulatory Referral For Surgery Scheduling     Return in about 6 weeks (around 05/01/2024) for postop check.  Glenys GORMAN Birk, MD 03/20/2024 1:15 PM    "

## 2024-03-21 ENCOUNTER — Telehealth: Payer: Self-pay

## 2024-03-21 NOTE — Telephone Encounter (Signed)
 I called patient to notify her that Dr. Erik has surgery availability on 04/30/24 at [facility] at 10:30 am. I asked patient to call me back to confirm details.

## 2024-03-28 ENCOUNTER — Encounter: Admitting: Obstetrics and Gynecology

## 2024-03-28 ENCOUNTER — Telehealth: Payer: Self-pay

## 2024-03-28 NOTE — Telephone Encounter (Signed)
 Received call from patient who had an IOL for IUFD at 30 weeks on 02/26/24.  She says her bleeding had tapered off and was brown discharge and then last week she started to have heavy menstrual like bleeding.  She says she was not soaking through a pad an hour, but it was heavier than she's used to.  It has lightened to a moderate bleed now.  Last night, out of nowhere, she began feeling profoundly dizzy and could not walk to her bedroom without weaving like I was drunk.   She woke up in the night drenched in sweat and uncomfortably hot.   This happened a couple times in the night.  She vomited once, but otherwise says she does not feel ill and no one in her family is sick.  She says she occasionally has a headache, but it is not severe and goes away with Tylenol .  She denies any vision changes.  Hgb was 12.0 on 02/26/24.  She does not have a way to check her blood pressure.  Advised she is likely experiencing hormonal shifts that are normal in the weeks following delivery.  She has stopped her prenatal vitamin, but recommended she start it again or another vitamin containing iron.  Encouraged increased fluids and Pedialyte to replenish her electrolytes.  Patient verbalized understanding and agreement, but she says she has never felt anything like this with her last pregnancies and feels like something is off.  Offered her an appointment Friday afternoon to discuss her symptoms with the midwife.  Patient agreed.

## 2024-03-30 ENCOUNTER — Encounter: Payer: Self-pay | Admitting: Advanced Practice Midwife

## 2024-03-30 ENCOUNTER — Ambulatory Visit: Admitting: Advanced Practice Midwife

## 2024-03-30 DIAGNOSIS — O364XX0 Maternal care for intrauterine death, not applicable or unspecified: Secondary | ICD-10-CM

## 2024-03-30 NOTE — Progress Notes (Signed)
 "  Patient ID: Christina Marquez, female   DOB: 1985/04/11, 39 y.o.   MRN: [id number]  Reason for Visit: Postpartum Follow-up   Subjective:  HPI:  Christina Marquez is a 39 y.o. female being seen at approximately 4 weeks postpartum- vaginal delivery of IUFD at [redacted]w[redacted]d on 02/26/2024. She reports bleeding stopped about 1 or 2 weeks ago and then started up again like a period 6 days ago. She denies any breast concerns or any problems with bowel or bladder. She reports a decreased appetite and is trying to eat more. Had an episode of lightheadedness last week. Encouraged her to stay hydrated and to eat regular meals with protein at each. She reports feeling ready to go back to work as she is feeling worse emotionally at home with isolation. She was cleared by [facility] for [facility] to return to work and she requests a work note to cover today's visit at [facility]. She has a scheduled tubal ligation with Dr Fredirick.  Past Medical History:  Diagnosis Date   Anxiety    Contact with and (suspected) exposure to viral hepatitis 11/02/2018   Hepatitis C antibody test positive 11/16/2018   Vaginal Pap smear, abnormal 2003   LEEP   Family History  Problem Relation Age of Onset   Diabetes Mother    Emphysema Mother    Allergies Mother    Asthma Mother    Hypertension Mother    Depression Mother    Lung cancer Mother    Seizures Father    Emphysema Father    Depression Father    Prostate cancer Father    Post-traumatic stress disorder Brother    Anxiety disorder Brother    Asthma Daughter    Asthma Son    Breast cancer Maternal Aunt    Heart attack Maternal Aunt    Lung cancer Maternal Grandmother    Heart attack Maternal Grandmother    Leukemia Maternal Grandfather    Past Surgical History:  Procedure Laterality Date   CESAREAN SECTION  2003   LEEP  2003   wisdom teeth extraction  2024    Short Social History:  Social History   Tobacco Use   Smoking status: Every Day     Current packs/day: 0.50    Types: Cigarettes   Smokeless tobacco: Never  Substance Use Topics   Alcohol use: Not Currently    Alcohol/week: 26.0 standard drinks of alcohol    Types: 24 Cans of beer, 2 Shots of liquor per week    Comment: last use approx 2 mo ago    Allergies[1]  Current Outpatient Medications  Medication Sig Dispense Refill   acetaminophen  (TYLENOL ) 325 MG tablet Take 2 tablets (650 mg total) by mouth every 4 (four) hours as needed for mild pain (pain score 1-3) (for pain scale < 4  OR  temperature  >/=  100.5 F). (Patient not taking: Reported on 03/30/2024) 30 tablet 0   hydrOXYzine  (VISTARIL ) 50 MG capsule Take 1 capsule (50 mg total) by mouth 3 (three) times daily as needed. (Patient not taking: Reported on 03/30/2024) 30 capsule 0   nicotine  polacrilex (NICOTINE  MINI) 2 MG lozenge Take 1 lozenge (2 mg total) by mouth as needed for smoking cessation. (Patient not taking: Reported on 03/30/2024) 100 tablet 0   witch hazel-glycerin  (TUCKS) pad Apply 1 Application topically as needed for itching. (Patient not taking: Reported on 03/30/2024) 40 each 12   No current facility-administered medications for this visit.  Review of Systems  Constitutional:  Negative for chills and fever.       Positive for decreased appetite  HENT:  Negative for congestion, ear discharge, ear pain, hearing loss, sinus pain and sore throat.   Eyes:  Negative for blurred vision and double vision.  Respiratory:  Negative for cough, shortness of breath and wheezing.   Cardiovascular:  Negative for chest pain, palpitations and leg swelling.  Gastrointestinal:  Negative for abdominal pain, blood in stool, constipation, diarrhea, heartburn, melena, nausea and vomiting.  Genitourinary:  Negative for dysuria, flank pain, frequency, hematuria and urgency.  Musculoskeletal:  Negative for back pain, joint pain and myalgias.  Skin:  Negative for itching and rash.  Neurological:  Negative for dizziness,  tingling, tremors, sensory change, speech change, focal weakness, seizures, loss of consciousness, weakness and headaches.       Positive for feeling lightheaded  Endo/Heme/Allergies:  Negative for environmental allergies. Does not bruise/bleed easily.  Psychiatric/Behavioral:  Negative for depression, hallucinations, memory loss, substance abuse and suicidal ideas. The patient is not nervous/anxious and does not have insomnia.        Objective:  Objective   Vitals:   03/30/24 1602  BP: 115/72  Pulse: 80  Weight: 204 lb 12.8 oz (92.9 kg)   Body mass index is 33.06 kg/m. Constitutional: obese female in no acute distress HEENT: normal Skin: Warm and dry.  Cardiovascular: Regular rate and rhythm.   Respiratory: Clear to auscultation bilateral. Normal respiratory effort Abdomen: no masses or tenderness, 1F diastasis Psych: Alert and Oriented x3.  Normal mood/affect- grieving loss of baby     Edinburgh Postnatal Depression Scale - 03/30/24 1644       Edinburgh Postnatal Depression Scale:  In the Past 7 Days   I have been able to laugh and see the funny side of things. 1    I have looked forward with enjoyment to things. 1    I have blamed myself unnecessarily when things went wrong. 1    I have been anxious or worried for no good reason. 2    I have felt scared or panicky for no good reason. 0    Things have been getting on top of me. 2    I have been so unhappy that I have had difficulty sleeping. 2    I have felt sad or miserable. 1    I have been so unhappy that I have been crying. 1    The thought of harming myself has occurred to me. 0    Edinburgh Postnatal Depression Scale Total 11           Assessment/Plan:     39 year old G39 P51 female at 4 weeks postpartum vaginal delivery- IUFD at 31 weeks- feeling well physically, continues to go through the grieving process  Eat regular meals with protein Stay well hydrated Return to clinic as needed   Slater Rains  CNM       [1] No Known Allergies  "

## 2024-04-04 NOTE — H&P (Signed)
 Christina Marquez is an 39 y.o. G110P2102 female.   Chief Complaint: undesired fertility HPI: desires permanent sterility  Past Medical History:  Diagnosis Date   Anxiety    Contact with and (suspected) exposure to viral hepatitis 11/02/2018   Hepatitis C antibody test positive 11/16/2018   Vaginal Pap smear, abnormal 2003   LEEP    Past Surgical History:  Procedure Laterality Date   CESAREAN SECTION  2003   LEEP  2003   wisdom teeth extraction  2024    Family History  Problem Relation Age of Onset   Diabetes Mother    Emphysema Mother    Allergies Mother    Asthma Mother    Hypertension Mother    Depression Mother    Lung cancer Mother    Seizures Father    Emphysema Father    Depression Father    Prostate cancer Father    Post-traumatic stress disorder Brother    Anxiety disorder Brother    Asthma Daughter    Asthma Son    Breast cancer Maternal Aunt    Heart attack Maternal Aunt    Lung cancer Maternal Grandmother    Heart attack Maternal Grandmother    Leukemia Maternal Grandfather    Social History:  reports that she has been smoking cigarettes. She has never used smokeless tobacco. She reports that she does not currently use alcohol after a past usage of about 26.0 standard drinks of alcohol per week. She reports current drug use. Frequency: 7.00 times per week. Drug: Marijuana.  Allergies: Allergies[1]  No medications prior to admission.    Pertinent items are noted in HPI.  Last menstrual period 03/23/2024. General appearance: alert, cooperative, and appears stated age Head: Normocephalic, without obvious abnormality, atraumatic Neck: supple, symmetrical, trachea midline Lungs: normal effort Heart: regular rate and rhythm Abdomen: soft, non-tender; bowel sounds normal; no masses,  no organomegaly Skin: Skin color, texture, turgor normal. No rashes or lesions Neurologic: Grossly normal   No results found for this or any previous visit (from the past 24  hours). No results found.  Assessment/Plan Active Problems:   Encounter for sterilization  For laparoscopic bilateral salpingectomy Risks include but are not limited to bleeding, infection, injury to surrounding structures, including bowel, bladder and ureters, blood clots, and death.  Likelihood of success is high.    Glenys GORMAN Birk 04/04/2024, 4:20 PM       [1] No Known Allergies

## 2024-04-12 ENCOUNTER — Encounter (HOSPITAL_COMMUNITY): Payer: Self-pay | Admitting: Family Medicine

## 2024-04-13 ENCOUNTER — Telehealth: Payer: Self-pay | Admitting: Obstetrics and Gynecology

## 2024-04-13 DIAGNOSIS — Z01818 Encounter for other preprocedural examination: Secondary | ICD-10-CM

## 2024-04-13 NOTE — Progress Notes (Signed)
 Left voicemail to return call.

## 2024-04-13 NOTE — Progress Notes (Signed)
" ° °  TELEHEALTH GYNECOLOGY VISIT ENCOUNTER NOTE  [facility]   Patient location: Home  I connected with Christina Marquez on 04/13/24 at 11:15 AM EST by MyChart audiovisual encounter   History:  Christina Marquez is a 39 y.o. [id number] presenting today for pre op appointment.   Has laparoscopic bilateral salpingectomy scheduled with Dr. Fredirick on 04/17/24. Was initially scheduled w/ me when I was planned for her surgery but date changed and she is back with Fredirick.  She has a couple of questions about day-of expectations and recovery.     Past Medical History:  Diagnosis Date   Anxiety    Contact with and (suspected) exposure to viral hepatitis 11/02/2018   Hepatitis C antibody test positive 11/16/2018   Vaginal Pap smear, abnormal 2003   LEEP   Past Surgical History:  Procedure Laterality Date   CESAREAN SECTION  2003   LEEP  2003   wisdom teeth extraction  2024   The following portions of the patient's history were reviewed and updated as appropriate: allergies, current medications, past family history, past medical history, past social history, past surgical history and problem list.   Review of Systems:  Pertinent items noted in HPI and remainder of comprehensive ROS otherwise negative.  Physical Exam:   General:  Alert, oriented and cooperative.   Mental Status: Normal mood and affect perceived. Normal judgment and thought content.  Physical exam deferred due to nature of the encounter  Labs and Imaging No results found for this or any previous visit (from the past 2 weeks). No results found.    Assessment and Plan:   Pre op eval for tubal ligation Dicussed expectations for day of surgery including anticipated operative and PACU time Reviewed recovery & restrictions. She is a location manager and works weekends, so I recommend she takes the weekend off following surgery as she will likely still be needing narcotic pain medication and  she expressed her understanding.  No questions about surgery itself or risks, she expressed good understanding of the plan  I provided 5 minutes of non-face-to-face time during this encounter.  Kieth JAYSON Carolin, MD [facility] [employer]  "

## 2024-04-16 ENCOUNTER — Encounter (HOSPITAL_COMMUNITY): Payer: Self-pay | Admitting: Family Medicine

## 2024-04-16 NOTE — Progress Notes (Signed)
 Spoke w/ via phone for pre-op interview---Christina Marquez Lab needs dos----UPT CBC         Lab results------ COVID test -----patient states asymptomatic no test needed Arrive at -------1200 NPO after MN NO Solid Food.  Clear liquids from MN until---1100 Pre-Surgery Ensure or G2:  Med rec completed Medications to take morning of surgery -----NA Diabetic medication -----  GLP1 agonist last dose: GLP1 instructions:  Patient instructed no nail polish to be worn day of surgery Patient instructed to bring photo id and insurance card day of surgery Patient aware to have Driver (ride ) / caregiver    for 24 hours after surgery - Driver  (Brother) Christina Marquez [phone] caregiver at home (son) Christina Marquez [phone] Patient Special Instructions ----- Pre-Op special Instructions -----  Patient verbalized understanding of instructions that were given at this phone interview. Patient denies chest pain, sob, fever, cough at the interview.

## 2024-04-17 ENCOUNTER — Other Ambulatory Visit: Payer: Self-pay

## 2024-04-17 ENCOUNTER — Ambulatory Visit (HOSPITAL_COMMUNITY): Admitting: Anesthesiology

## 2024-04-17 ENCOUNTER — Encounter (HOSPITAL_COMMUNITY)
Admission: RE | Disposition: A | Discharge status: Home / Self Care | Payer: Self-pay | Source: Home / Self Care | Attending: Family Medicine

## 2024-04-17 ENCOUNTER — Ambulatory Visit (HOSPITAL_COMMUNITY)
Admission: RE | Admit: 2024-04-17 | Discharge: 2024-04-17 | Disposition: A | Attending: Family Medicine | Admitting: Family Medicine

## 2024-04-17 ENCOUNTER — Encounter (HOSPITAL_COMMUNITY): Payer: Self-pay | Admitting: Family Medicine

## 2024-04-17 DIAGNOSIS — I10 Essential (primary) hypertension: Secondary | ICD-10-CM | POA: Diagnosis not present

## 2024-04-17 DIAGNOSIS — Z302 Encounter for sterilization: Secondary | ICD-10-CM | POA: Diagnosis present

## 2024-04-17 DIAGNOSIS — N838 Other noninflammatory disorders of ovary, fallopian tube and broad ligament: Secondary | ICD-10-CM | POA: Diagnosis not present

## 2024-04-17 DIAGNOSIS — Z01818 Encounter for other preprocedural examination: Secondary | ICD-10-CM

## 2024-04-17 DIAGNOSIS — F418 Other specified anxiety disorders: Secondary | ICD-10-CM | POA: Diagnosis not present

## 2024-04-17 DIAGNOSIS — F17210 Nicotine dependence, cigarettes, uncomplicated: Secondary | ICD-10-CM | POA: Insufficient documentation

## 2024-04-17 DIAGNOSIS — Z8619 Personal history of other infectious and parasitic diseases: Secondary | ICD-10-CM | POA: Insufficient documentation

## 2024-04-17 HISTORY — DX: Other psychoactive substance abuse, uncomplicated: F19.10

## 2024-04-17 HISTORY — PX: LAPAROSCOPIC BILATERAL SALPINGECTOMY: SHX5889

## 2024-04-17 HISTORY — DX: Gastro-esophageal reflux disease without esophagitis: K21.9

## 2024-04-17 HISTORY — DX: Depression, unspecified: F32.A

## 2024-04-17 LAB — CBC
HCT: 41.9 % (ref 36.0–46.0)
Hemoglobin: 13.9 g/dL (ref 12.0–15.0)
MCH: 30 pg (ref 26.0–34.0)
MCHC: 33.2 g/dL (ref 30.0–36.0)
MCV: 90.3 fL (ref 80.0–100.0)
Platelets: 317 K/uL (ref 150–400)
RBC: 4.64 MIL/uL (ref 3.87–5.11)
RDW: 14 % (ref 11.5–15.5)
WBC: 13.1 K/uL — ABNORMAL HIGH (ref 4.0–10.5)
nRBC: 0 % (ref 0.0–0.2)

## 2024-04-17 LAB — POCT PREGNANCY, URINE: Preg Test, Ur: NEGATIVE

## 2024-04-17 MED ORDER — ACETAMINOPHEN 500 MG PO TABS
ORAL_TABLET | ORAL | Status: AC
  Filled 2024-04-17: qty 2

## 2024-04-17 MED ORDER — OXYCODONE HCL 5 MG PO TABS: 5.0000 mg | ORAL_TABLET | Freq: Four times a day (QID) | ORAL | 0 refills | Status: AC | PRN

## 2024-04-17 MED ORDER — AMISULPRIDE (ANTIEMETIC) 5 MG/2ML IV SOLN
INTRAVENOUS | Status: AC
  Filled 2024-04-17: qty 4

## 2024-04-17 MED ORDER — OXYCODONE HCL 5 MG PO TABS: 5.0000 mg | ORAL_TABLET | Freq: Once | ORAL | Status: DC | PRN

## 2024-04-17 MED ORDER — CHLORHEXIDINE GLUCONATE 0.12 % MT SOLN: 15.0000 mL | Freq: Once | OROMUCOSAL | Status: AC

## 2024-04-17 MED ORDER — ACETAMINOPHEN 500 MG PO TABS
1000.0000 mg | ORAL_TABLET | ORAL | Status: AC
  Administered 2024-04-17: 1000 mg via ORAL

## 2024-04-17 MED ORDER — HYDROMORPHONE HCL 1 MG/ML IJ SOLN
0.2500 mg | INTRAMUSCULAR | Status: DC | PRN
  Administered 2024-04-17: 0.25 mg via INTRAVENOUS

## 2024-04-17 MED ORDER — HYDROMORPHONE HCL 1 MG/ML IJ SOLN
INTRAMUSCULAR | Status: AC
  Filled 2024-04-17: qty 0.5

## 2024-04-17 MED ORDER — 0.9 % SODIUM CHLORIDE (POUR BTL) OPTIME
TOPICAL | Status: DC | PRN
  Administered 2024-04-17: 1000 mL

## 2024-04-17 MED ORDER — PROPOFOL 10 MG/ML IV BOLUS
INTRAVENOUS | Status: DC | PRN
  Administered 2024-04-17: 200 mg via INTRAVENOUS

## 2024-04-17 MED ORDER — KETOROLAC TROMETHAMINE 30 MG/ML IJ SOLN
30.0000 mg | Freq: Once | INTRAMUSCULAR | Status: AC | PRN
  Administered 2024-04-17: 30 mg via INTRAVENOUS

## 2024-04-17 MED ORDER — FENTANYL CITRATE (PF) 250 MCG/5ML IJ SOLN
INTRAMUSCULAR | Status: DC | PRN
  Administered 2024-04-17: 100 ug via INTRAVENOUS

## 2024-04-17 MED ORDER — HYDROMORPHONE HCL 1 MG/ML IJ SOLN
INTRAMUSCULAR | Status: AC
  Filled 2024-04-17: qty 1

## 2024-04-17 MED ORDER — MEPERIDINE HCL 25 MG/ML IJ SOLN: 6.2500 mg | INTRAMUSCULAR | Status: DC | PRN

## 2024-04-17 MED ORDER — LACTATED RINGERS IV SOLN: INTRAVENOUS | Status: DC

## 2024-04-17 MED ORDER — KETOROLAC TROMETHAMINE 30 MG/ML IJ SOLN
INTRAMUSCULAR | Status: AC
  Filled 2024-04-17: qty 1

## 2024-04-17 MED ORDER — ROCURONIUM BROMIDE 10 MG/ML (PF) SYRINGE
PREFILLED_SYRINGE | INTRAVENOUS | Status: AC
  Filled 2024-04-17: qty 10

## 2024-04-17 MED ORDER — MIDAZOLAM HCL 2 MG/2ML IJ SOLN
INTRAMUSCULAR | Status: AC
  Filled 2024-04-17: qty 2

## 2024-04-17 MED ORDER — CHLORHEXIDINE GLUCONATE 0.12 % MT SOLN
OROMUCOSAL | Status: AC
  Administered 2024-04-17: 15 mL via OROMUCOSAL
  Filled 2024-04-17: qty 15

## 2024-04-17 MED ORDER — BUPIVACAINE HCL (PF) 0.25 % IJ SOLN
INTRAMUSCULAR | Status: DC | PRN
  Administered 2024-04-17: 15 mL

## 2024-04-17 MED ORDER — AMISULPRIDE (ANTIEMETIC) 5 MG/2ML IV SOLN
10.0000 mg | Freq: Once | INTRAVENOUS | Status: AC | PRN
  Administered 2024-04-17: 10 mg via INTRAVENOUS

## 2024-04-17 MED ORDER — SUGAMMADEX SODIUM 200 MG/2ML IV SOLN
INTRAVENOUS | Status: DC | PRN
  Administered 2024-04-17: 400 mg via INTRAVENOUS

## 2024-04-17 MED ORDER — POVIDONE-IODINE 10 % EX SWAB: 2.0000 | Freq: Once | CUTANEOUS | Status: DC

## 2024-04-17 MED ORDER — ROCURONIUM BROMIDE 10 MG/ML (PF) SYRINGE
PREFILLED_SYRINGE | INTRAVENOUS | Status: DC | PRN
  Administered 2024-04-17: 70 mg via INTRAVENOUS

## 2024-04-17 MED ORDER — LIDOCAINE 2% (20 MG/ML) 5 ML SYRINGE
INTRAMUSCULAR | Status: AC
  Filled 2024-04-17: qty 5

## 2024-04-17 MED ORDER — PHENYLEPHRINE 80 MCG/ML (10ML) SYRINGE FOR IV PUSH (FOR BLOOD PRESSURE SUPPORT)
PREFILLED_SYRINGE | INTRAVENOUS | Status: AC
  Filled 2024-04-17: qty 10

## 2024-04-17 MED ORDER — MIDAZOLAM HCL (PF) 2 MG/2ML IJ SOLN
INTRAMUSCULAR | Status: DC | PRN
  Administered 2024-04-17: 2 mg via INTRAVENOUS

## 2024-04-17 MED ORDER — ONDANSETRON HCL 4 MG/2ML IJ SOLN
INTRAMUSCULAR | Status: DC | PRN
  Administered 2024-04-17: 4 mg via INTRAVENOUS

## 2024-04-17 MED ORDER — OXYCODONE HCL 5 MG/5ML PO SOLN: 5.0000 mg | Freq: Once | ORAL | Status: DC | PRN

## 2024-04-17 MED ORDER — ORAL CARE MOUTH RINSE: 15.0000 mL | Freq: Once | OROMUCOSAL | Status: AC

## 2024-04-17 MED ORDER — HYDROMORPHONE HCL 1 MG/ML IJ SOLN
INTRAMUSCULAR | Status: DC | PRN
  Administered 2024-04-17 (×2): .5 mg via INTRAVENOUS

## 2024-04-17 MED ORDER — LIDOCAINE 2% (20 MG/ML) 5 ML SYRINGE
INTRAMUSCULAR | Status: DC | PRN
  Administered 2024-04-17: 60 mg via INTRAVENOUS

## 2024-04-17 MED ORDER — PROPOFOL 10 MG/ML IV BOLUS
INTRAVENOUS | Status: AC
  Filled 2024-04-17: qty 20

## 2024-04-17 MED ORDER — FENTANYL CITRATE (PF) 100 MCG/2ML IJ SOLN
INTRAMUSCULAR | Status: AC
  Filled 2024-04-17: qty 2

## 2024-04-17 MED ORDER — ONDANSETRON HCL 4 MG/2ML IJ SOLN: 4.0000 mg | Freq: Once | INTRAMUSCULAR | Status: DC | PRN

## 2024-04-17 MED ORDER — DEXMEDETOMIDINE HCL IN NACL 80 MCG/20ML IV SOLN
INTRAVENOUS | Status: DC | PRN
  Administered 2024-04-17: 12 ug via INTRAVENOUS
  Administered 2024-04-17 (×2): 10 ug via INTRAVENOUS

## 2024-04-17 MED ORDER — DEXAMETHASONE SOD PHOSPHATE PF 10 MG/ML IJ SOLN
INTRAMUSCULAR | Status: DC | PRN
  Administered 2024-04-17: 10 mg via INTRAVENOUS

## 2024-04-17 NOTE — Transfer of Care (Signed)
 Immediate Anesthesia Transfer of Care Note  Patient: Christina Marquez  Procedure(s) Performed: SALPINGECTOMY, BILATERAL, LAPAROSCOPIC (Bilateral: Pelvis)  Patient Location: PACU  Anesthesia Type:General  Level of Consciousness: awake, alert , and oriented  Airway & Oxygen Therapy: Patient Spontanous Breathing and Patient connected to face mask oxygen  Post-op Assessment: Report given to RN and Post -op Vital signs reviewed and stable  Post vital signs: Reviewed and stable  Last Vitals:  Vitals Value Taken Time  BP 153/104 04/17/24 15:15  Temp 36.5 C 04/17/24 15:15  Pulse 70 04/17/24 15:28  Resp 29 04/17/24 15:28  SpO2 89 % 04/17/24 15:28  Vitals shown include unfiled device data.  Last Pain:  Vitals:   04/17/24 1521  TempSrc:   PainSc: 8       Patients Stated Pain Goal: 3 (04/17/24 1250)  Complications: No notable events documented.

## 2024-04-17 NOTE — Interval H&P Note (Signed)
 History and Physical Interval Note:  04/17/2024 1:27 PM  Christina Marquez  has presented today for surgery, with the diagnosis of Undesired fertility.  The various methods of treatment have been discussed with the patient and family. After consideration of risks, benefits and other options for treatment, the patient has consented to  Procedures: SALPINGECTOMY, BILATERAL, LAPAROSCOPIC (Bilateral) as a surgical intervention.  The patient's history has been reviewed, patient examined, no change in status, stable for surgery.  I have reviewed the patient's chart and labs.  Questions were answered to the patient's satisfaction.     Glenys GORMAN Birk

## 2024-04-17 NOTE — Anesthesia Preprocedure Evaluation (Addendum)
"                                    Anesthesia Evaluation  Patient identified by MRN, date of birth, ID band Patient awake    Reviewed: Allergy & Precautions, NPO status , Patient's Chart, lab work & pertinent test results  Airway Mallampati: II  TM Distance: >3 FB Neck ROM: Full    Dental  (+) Teeth Intact, Dental Advisory Given   Pulmonary Current Smoker Occasional cigarettes   Pulmonary exam normal breath sounds clear to auscultation       Cardiovascular hypertension (151/97 preop, no home meds), Normal cardiovascular exam Rhythm:Regular Rate:Normal     Neuro/Psych  PSYCHIATRIC DISORDERS Anxiety Depression    negative neurological ROS     GI/Hepatic ,GERD  ,,(+)     substance abuse (daily marijuana, denies daily etoh)  alcohol use and marijuana use, Hepatitis -, C  Endo/Other  BMI 32  Renal/GU negative Renal ROS  negative genitourinary   Musculoskeletal negative musculoskeletal ROS (+)    Abdominal  (+) + obese  Peds  Hematology negative hematology ROS (+)   Anesthesia Other Findings   Reproductive/Obstetrics Urine preg neg today Desires sterility                              Anesthesia Physical Anesthesia Plan  ASA: 3  Anesthesia Plan: General   Post-op Pain Management: Tylenol  PO (pre-op)*, Toradol  IV (intra-op)*, Precedex  and Dilaudid  IV   Induction: Intravenous  PONV Risk Score and Plan: 3 and Ondansetron , Dexamethasone , Midazolam , Scopolamine patch - Pre-op and Treatment may vary due to age or medical condition  Airway Management Planned: Oral ETT  Additional Equipment: None  Intra-op Plan:   Post-operative Plan: Extubation in OR  Informed Consent: I have reviewed the patients History and Physical, chart, labs and discussed the procedure including the risks, benefits and alternatives for the proposed anesthesia with the patient or authorized representative who has indicated his/her understanding and  acceptance.     Dental advisory given  Plan Discussed with: CRNA  Anesthesia Plan Comments:          Anesthesia Quick Evaluation  "

## 2024-04-17 NOTE — Anesthesia Procedure Notes (Signed)
 Procedure Name: Intubation Date/Time: 04/17/2024 2:16 PM  Performed by: Hedy Jarred, CRNAPre-anesthesia Checklist: Patient identified, Emergency Drugs available, Suction available and Patient being monitored Patient Re-evaluated:Patient Re-evaluated prior to induction Oxygen Delivery Method: Circle System Utilized Preoxygenation: Pre-oxygenation with 100% oxygen Induction Type: IV induction Ventilation: Mask ventilation without difficulty Laryngoscope Size: Miller and 2 Grade View: Grade I Tube type: Oral Tube size: 7.0 mm Number of attempts: 1 Airway Equipment and Method: Stylet and Oral airway Placement Confirmation: ETT inserted through vocal cords under direct vision, positive ETCO2 and breath sounds checked- equal and bilateral Secured at: 21 cm Tube secured with: Tape Dental Injury: Teeth and Oropharynx as per pre-operative assessment

## 2024-04-17 NOTE — Op Note (Signed)
 PROCEDURE DATE: 04/17/2024  PREOPERATIVE DIAGNOSES: Undesired fertility  POSTOPERATIVE DIAGNOSES: The same   PROCEDURE: Laparoscopic bilateral salpingctomy   SURGEON: Dr. Glenys GORMAN Birk   ASSISTANT: Daine Abbot, RNFA  ANESTHESIOLOGIST: Merla Almarie HERO, DO MD - GETT  INDICATIONS: 39 y.o. [id number] who desired permanent sterilization.  FINDINGS: Normal appearing uterus, tubes and ovaries   ESTIMATED BLOOD LOSS: 25 ml   SPECIMENS: Bilateral fallopian tubes to pathology  COMPLICATIONS: None immediately known   PROCEDURE IN DETAIL: The patient had sequential compression devices applied to her lower extremities while in the preoperative area. She was then taken to the operating room where general anesthesia was administered and was found to be adequate. She was placed in the dorsal lithotomy position, and was prepped and draped in a sterile manner. A foley catheter was inserted into her bladder and drained a clear 150 cc of urine. Attention was then turned to the patient's abdomen where a 5-mm skin incision was made in the umbilicus.  Opti-view used to place intraperitoneally. Intraperitoneal placement was confirmed and insufflation done. A survey of the patient's pelvis and abdomen revealed the findings above. Two bilateral 5-mm lower quadrant ports were then placed under direct visualization. On the right side, the tube was separated from the mesosalpinx with the Harmonic device.  On the left side, the tube was separated from the mesosalpinx with the Harmonic device.  The specimen was then removed from the abdomen through the 5-mm port, under direct visualization. The operative site was surveyed, and found to be hemostatic. No intraoperative injury to other surrounding organs was noted. The abdomen was desufflated and all instruments were then removed from the patient's abdomen. All skin incisions were closed with 3-0 Vicryl subcuticular stitches/Dermabond.   Glenys GORMAN Birk  MD 04/17/2024 2:41 PM

## 2024-04-17 NOTE — Discharge Instructions (Addendum)
" ° ° ° °  No acetaminophen /Tylenol  until after 7:00 pm today if needed.  No ibuprofen , Advil , Aleve , Motrin , ketorolac , meloxicam , naproxen , or other NSAIDS until after 9:20 pm today if needed. "

## 2024-04-17 NOTE — Anesthesia Postprocedure Evaluation (Signed)
"   Anesthesia Post Note  Patient: Christina Marquez  Procedure(s) Performed: SALPINGECTOMY, BILATERAL, LAPAROSCOPIC (Bilateral: Pelvis)     Patient location during evaluation: PACU Anesthesia Type: General Level of consciousness: awake and alert, oriented and patient cooperative Pain management: pain level controlled Vital Signs Assessment: post-procedure vital signs reviewed and stable Respiratory status: spontaneous breathing, nonlabored ventilation and respiratory function stable Cardiovascular status: blood pressure returned to baseline and stable Postop Assessment: no apparent nausea or vomiting Anesthetic complications: no   No notable events documented.  Last Vitals:  Vitals:   04/17/24 1600 04/17/24 1615  BP: (!) 140/95 123/83  Pulse: 65 66  Resp: 18 20  Temp:    SpO2: 95% 94%    Last Pain:  Vitals:   04/17/24 1615  TempSrc:   PainSc: 0-No pain                 Almarie HERO Imaan Padgett      "

## 2024-04-18 ENCOUNTER — Encounter (HOSPITAL_COMMUNITY): Payer: Self-pay | Admitting: Family Medicine

## 2024-04-18 LAB — SURGICAL PATHOLOGY
